# Patient Record
Sex: Female | Born: 1960
Health system: Southern US, Community
[De-identification: ages and names within clinical notes are randomized; demographics above are authoritative.]

## PROBLEM LIST (undated history)

## (undated) DIAGNOSIS — Z8739 Personal history of other diseases of the musculoskeletal system and connective tissue: Secondary | ICD-10-CM

## (undated) DIAGNOSIS — E559 Vitamin D deficiency, unspecified: Secondary | ICD-10-CM

## (undated) DIAGNOSIS — E785 Hyperlipidemia, unspecified: Secondary | ICD-10-CM

## (undated) DIAGNOSIS — M1990 Unspecified osteoarthritis, unspecified site: Secondary | ICD-10-CM

## (undated) DIAGNOSIS — C4490 Unspecified malignant neoplasm of skin, unspecified: Secondary | ICD-10-CM

## (undated) DIAGNOSIS — L309 Dermatitis, unspecified: Secondary | ICD-10-CM

## (undated) DIAGNOSIS — F419 Anxiety disorder, unspecified: Secondary | ICD-10-CM

## (undated) DIAGNOSIS — K829 Disease of gallbladder, unspecified: Secondary | ICD-10-CM

## (undated) DIAGNOSIS — I10 Essential (primary) hypertension: Secondary | ICD-10-CM

## (undated) DIAGNOSIS — M0520 Rheumatoid vasculitis with rheumatoid arthritis of unspecified site: Secondary | ICD-10-CM

## (undated) DIAGNOSIS — M7989 Other specified soft tissue disorders: Secondary | ICD-10-CM

## (undated) DIAGNOSIS — G2581 Restless legs syndrome: Secondary | ICD-10-CM

## (undated) DIAGNOSIS — K5900 Constipation, unspecified: Secondary | ICD-10-CM

## (undated) DIAGNOSIS — G4730 Sleep apnea, unspecified: Secondary | ICD-10-CM

## (undated) DIAGNOSIS — C801 Malignant (primary) neoplasm, unspecified: Secondary | ICD-10-CM

## (undated) HISTORY — DX: Hyperlipidemia, unspecified: E78.5

## (undated) HISTORY — DX: Unspecified malignant neoplasm of skin, unspecified: C44.90

## (undated) HISTORY — DX: Sleep apnea, unspecified: G47.30

## (undated) HISTORY — DX: Disease of gallbladder, unspecified: K82.9

## (undated) HISTORY — DX: Dermatitis, unspecified: L30.9

## (undated) HISTORY — DX: Vitamin D deficiency, unspecified: E55.9

## (undated) HISTORY — DX: Personal history of other diseases of the musculoskeletal system and connective tissue: Z87.39

## (undated) HISTORY — DX: Essential (primary) hypertension: I10

## (undated) HISTORY — DX: Restless legs syndrome: G25.81

## (undated) HISTORY — DX: Anxiety disorder, unspecified: F41.9

## (undated) HISTORY — PX: CARPECTOMY: SHX5004

## (undated) HISTORY — DX: Unspecified osteoarthritis, unspecified site: M19.90

## (undated) HISTORY — PX: REPLACEMENT TOTAL KNEE: SUR1224

## (undated) HISTORY — DX: Other specified soft tissue disorders: M79.89

## (undated) HISTORY — DX: Rheumatoid vasculitis with rheumatoid arthritis of unspecified site: M05.20

## (undated) HISTORY — PX: ABLATION: SHX5711

## (undated) HISTORY — PX: EYE SURGERY: SHX253

## (undated) HISTORY — PX: CHOLECYSTECTOMY: SHX55

## (undated) HISTORY — DX: Constipation, unspecified: K59.00

---

## 2005-09-21 ENCOUNTER — Ambulatory Visit: Payer: Self-pay | Admitting: Internal Medicine

## 2006-09-26 ENCOUNTER — Ambulatory Visit: Payer: Self-pay | Admitting: Internal Medicine

## 2007-10-03 ENCOUNTER — Ambulatory Visit: Payer: Self-pay | Admitting: Internal Medicine

## 2008-10-03 ENCOUNTER — Ambulatory Visit: Payer: Self-pay | Admitting: Internal Medicine

## 2009-10-08 ENCOUNTER — Ambulatory Visit: Payer: Self-pay | Admitting: Internal Medicine

## 2010-10-15 ENCOUNTER — Ambulatory Visit: Payer: Self-pay | Admitting: Obstetrics & Gynecology

## 2011-11-10 ENCOUNTER — Ambulatory Visit: Payer: Self-pay | Admitting: Obstetrics & Gynecology

## 2012-04-20 ENCOUNTER — Ambulatory Visit: Payer: Self-pay | Admitting: Internal Medicine

## 2012-04-28 ENCOUNTER — Ambulatory Visit: Payer: Self-pay | Admitting: Internal Medicine

## 2012-10-06 ENCOUNTER — Ambulatory Visit: Payer: Self-pay | Admitting: Unknown Physician Specialty

## 2012-11-14 ENCOUNTER — Ambulatory Visit: Payer: Self-pay | Admitting: Internal Medicine

## 2013-01-29 DIAGNOSIS — N3941 Urge incontinence: Secondary | ICD-10-CM | POA: Insufficient documentation

## 2013-01-29 DIAGNOSIS — R351 Nocturia: Secondary | ICD-10-CM | POA: Insufficient documentation

## 2013-01-29 DIAGNOSIS — N393 Stress incontinence (female) (male): Secondary | ICD-10-CM | POA: Insufficient documentation

## 2013-01-29 DIAGNOSIS — R350 Frequency of micturition: Secondary | ICD-10-CM | POA: Insufficient documentation

## 2013-08-23 ENCOUNTER — Ambulatory Visit: Payer: Self-pay | Admitting: Family Medicine

## 2013-08-28 ENCOUNTER — Ambulatory Visit: Payer: Self-pay | Admitting: Family Medicine

## 2013-09-17 DIAGNOSIS — R339 Retention of urine, unspecified: Secondary | ICD-10-CM | POA: Insufficient documentation

## 2013-11-23 ENCOUNTER — Ambulatory Visit: Payer: Self-pay | Admitting: Family Medicine

## 2014-03-14 ENCOUNTER — Ambulatory Visit: Payer: Self-pay | Admitting: Podiatrist

## 2014-10-07 DIAGNOSIS — R197 Diarrhea, unspecified: Secondary | ICD-10-CM | POA: Insufficient documentation

## 2014-10-07 DIAGNOSIS — R1011 Right upper quadrant pain: Secondary | ICD-10-CM | POA: Insufficient documentation

## 2014-10-08 ENCOUNTER — Ambulatory Visit: Payer: Self-pay | Admitting: Unknown Physician Specialty

## 2014-10-09 ENCOUNTER — Ambulatory Visit: Payer: Self-pay | Admitting: Unknown Physician Specialty

## 2014-10-09 LAB — CLOSTRIDIUM DIFFICILE(ARMC)

## 2014-10-11 LAB — WBCS, STOOL

## 2014-10-13 LAB — STOOL CULTURE

## 2014-11-25 ENCOUNTER — Ambulatory Visit: Payer: Self-pay | Admitting: Obstetrics & Gynecology

## 2015-12-24 ENCOUNTER — Other Ambulatory Visit: Payer: Self-pay | Admitting: Obstetrics & Gynecology

## 2015-12-24 DIAGNOSIS — Z1231 Encounter for screening mammogram for malignant neoplasm of breast: Secondary | ICD-10-CM

## 2015-12-25 ENCOUNTER — Ambulatory Visit
Admission: RE | Admit: 2015-12-25 | Discharge: 2015-12-25 | Disposition: A | Discharge status: Home / Self Care | Payer: BLUE CROSS/BLUE SHIELD | Source: Ambulatory Visit | Attending: Obstetrics & Gynecology | Admitting: Obstetrics & Gynecology

## 2015-12-25 DIAGNOSIS — Z1231 Encounter for screening mammogram for malignant neoplasm of breast: Secondary | ICD-10-CM | POA: Insufficient documentation

## 2015-12-25 LAB — HM MAMMOGRAPHY

## 2016-03-15 DIAGNOSIS — M25562 Pain in left knee: Secondary | ICD-10-CM | POA: Diagnosis not present

## 2016-03-17 DIAGNOSIS — M25562 Pain in left knee: Secondary | ICD-10-CM | POA: Diagnosis not present

## 2016-03-17 DIAGNOSIS — G4733 Obstructive sleep apnea (adult) (pediatric): Secondary | ICD-10-CM | POA: Diagnosis not present

## 2016-03-23 ENCOUNTER — Other Ambulatory Visit: Payer: Self-pay | Admitting: Physician Assistant

## 2016-03-23 DIAGNOSIS — R1084 Generalized abdominal pain: Secondary | ICD-10-CM | POA: Diagnosis not present

## 2016-03-23 DIAGNOSIS — R1011 Right upper quadrant pain: Secondary | ICD-10-CM

## 2016-03-23 DIAGNOSIS — R197 Diarrhea, unspecified: Secondary | ICD-10-CM | POA: Diagnosis not present

## 2016-03-29 ENCOUNTER — Ambulatory Visit
Admission: RE | Admit: 2016-03-29 | Discharge: 2016-03-29 | Disposition: A | Discharge status: Home / Self Care | Payer: BLUE CROSS/BLUE SHIELD | Source: Ambulatory Visit | Attending: Physician Assistant | Admitting: Physician Assistant

## 2016-03-29 DIAGNOSIS — R1011 Right upper quadrant pain: Secondary | ICD-10-CM

## 2016-03-29 DIAGNOSIS — K760 Fatty (change of) liver, not elsewhere classified: Secondary | ICD-10-CM | POA: Insufficient documentation

## 2016-03-29 DIAGNOSIS — R1084 Generalized abdominal pain: Secondary | ICD-10-CM | POA: Diagnosis not present

## 2016-03-29 LAB — POCT I-STAT CREATININE: CREATININE: 0.8 mg/dL (ref 0.44–1.00)

## 2016-03-29 MED ORDER — IOPAMIDOL (ISOVUE-300) INJECTION 61%
100.0000 mL | Freq: Once | INTRAVENOUS | Status: AC | PRN
  Administered 2016-03-29: 100 mL via INTRAVENOUS

## 2016-03-31 ENCOUNTER — Other Ambulatory Visit: Payer: Self-pay | Admitting: Physician Assistant

## 2016-03-31 DIAGNOSIS — K760 Fatty (change of) liver, not elsewhere classified: Secondary | ICD-10-CM

## 2016-04-05 DIAGNOSIS — M9903 Segmental and somatic dysfunction of lumbar region: Secondary | ICD-10-CM | POA: Diagnosis not present

## 2016-04-05 DIAGNOSIS — M5387 Other specified dorsopathies, lumbosacral region: Secondary | ICD-10-CM | POA: Diagnosis not present

## 2016-04-06 ENCOUNTER — Ambulatory Visit
Admission: RE | Admit: 2016-04-06 | Discharge: 2016-04-06 | Disposition: A | Discharge status: Home / Self Care | Payer: BLUE CROSS/BLUE SHIELD | Source: Ambulatory Visit | Attending: Physician Assistant | Admitting: Physician Assistant

## 2016-04-06 DIAGNOSIS — Z9049 Acquired absence of other specified parts of digestive tract: Secondary | ICD-10-CM | POA: Diagnosis not present

## 2016-04-06 DIAGNOSIS — K760 Fatty (change of) liver, not elsewhere classified: Secondary | ICD-10-CM | POA: Insufficient documentation

## 2016-04-16 DIAGNOSIS — F419 Anxiety disorder, unspecified: Secondary | ICD-10-CM | POA: Diagnosis not present

## 2016-04-16 DIAGNOSIS — G2581 Restless legs syndrome: Secondary | ICD-10-CM | POA: Diagnosis not present

## 2016-04-27 DIAGNOSIS — R1084 Generalized abdominal pain: Secondary | ICD-10-CM | POA: Diagnosis not present

## 2016-04-27 DIAGNOSIS — K635 Polyp of colon: Secondary | ICD-10-CM | POA: Diagnosis not present

## 2016-04-27 DIAGNOSIS — R197 Diarrhea, unspecified: Secondary | ICD-10-CM | POA: Diagnosis not present

## 2016-04-27 DIAGNOSIS — D126 Benign neoplasm of colon, unspecified: Secondary | ICD-10-CM | POA: Diagnosis not present

## 2016-04-27 DIAGNOSIS — K621 Rectal polyp: Secondary | ICD-10-CM | POA: Diagnosis not present

## 2016-04-27 DIAGNOSIS — K640 First degree hemorrhoids: Secondary | ICD-10-CM | POA: Diagnosis not present

## 2016-04-27 DIAGNOSIS — K5730 Diverticulosis of large intestine without perforation or abscess without bleeding: Secondary | ICD-10-CM | POA: Diagnosis not present

## 2016-04-27 DIAGNOSIS — K648 Other hemorrhoids: Secondary | ICD-10-CM | POA: Diagnosis not present

## 2016-05-07 DIAGNOSIS — M9901 Segmental and somatic dysfunction of cervical region: Secondary | ICD-10-CM | POA: Diagnosis not present

## 2016-05-07 DIAGNOSIS — M5387 Other specified dorsopathies, lumbosacral region: Secondary | ICD-10-CM | POA: Diagnosis not present

## 2016-05-07 DIAGNOSIS — M9903 Segmental and somatic dysfunction of lumbar region: Secondary | ICD-10-CM | POA: Diagnosis not present

## 2016-05-07 DIAGNOSIS — M9902 Segmental and somatic dysfunction of thoracic region: Secondary | ICD-10-CM | POA: Diagnosis not present

## 2016-05-10 DIAGNOSIS — K5900 Constipation, unspecified: Secondary | ICD-10-CM | POA: Diagnosis not present

## 2016-05-10 DIAGNOSIS — K760 Fatty (change of) liver, not elsewhere classified: Secondary | ICD-10-CM | POA: Diagnosis not present

## 2016-05-12 DIAGNOSIS — M9901 Segmental and somatic dysfunction of cervical region: Secondary | ICD-10-CM | POA: Diagnosis not present

## 2016-05-12 DIAGNOSIS — M9902 Segmental and somatic dysfunction of thoracic region: Secondary | ICD-10-CM | POA: Diagnosis not present

## 2016-05-12 DIAGNOSIS — M5387 Other specified dorsopathies, lumbosacral region: Secondary | ICD-10-CM | POA: Diagnosis not present

## 2016-05-12 DIAGNOSIS — M9903 Segmental and somatic dysfunction of lumbar region: Secondary | ICD-10-CM | POA: Diagnosis not present

## 2016-05-19 DIAGNOSIS — R87610 Atypical squamous cells of undetermined significance on cytologic smear of cervix (ASC-US): Secondary | ICD-10-CM | POA: Diagnosis not present

## 2016-05-20 DIAGNOSIS — G2581 Restless legs syndrome: Secondary | ICD-10-CM | POA: Diagnosis not present

## 2016-05-20 DIAGNOSIS — F419 Anxiety disorder, unspecified: Secondary | ICD-10-CM | POA: Diagnosis not present

## 2016-05-26 DIAGNOSIS — M5386 Other specified dorsopathies, lumbar region: Secondary | ICD-10-CM | POA: Diagnosis not present

## 2016-05-26 DIAGNOSIS — G5721 Lesion of femoral nerve, right lower limb: Secondary | ICD-10-CM | POA: Diagnosis not present

## 2016-06-29 DIAGNOSIS — F419 Anxiety disorder, unspecified: Secondary | ICD-10-CM | POA: Diagnosis not present

## 2016-07-28 DIAGNOSIS — M530 Cervicocranial syndrome: Secondary | ICD-10-CM | POA: Diagnosis not present

## 2016-07-28 DIAGNOSIS — G44219 Episodic tension-type headache, not intractable: Secondary | ICD-10-CM | POA: Diagnosis not present

## 2016-07-29 DIAGNOSIS — L710 Perioral dermatitis: Secondary | ICD-10-CM | POA: Diagnosis not present

## 2016-09-08 DIAGNOSIS — M531 Cervicobrachial syndrome: Secondary | ICD-10-CM | POA: Diagnosis not present

## 2016-09-08 DIAGNOSIS — M9901 Segmental and somatic dysfunction of cervical region: Secondary | ICD-10-CM | POA: Diagnosis not present

## 2016-09-08 DIAGNOSIS — M436 Torticollis: Secondary | ICD-10-CM | POA: Diagnosis not present

## 2016-09-28 DIAGNOSIS — M9902 Segmental and somatic dysfunction of thoracic region: Secondary | ICD-10-CM | POA: Diagnosis not present

## 2016-09-28 DIAGNOSIS — M5387 Other specified dorsopathies, lumbosacral region: Secondary | ICD-10-CM | POA: Diagnosis not present

## 2016-09-28 DIAGNOSIS — M531 Cervicobrachial syndrome: Secondary | ICD-10-CM | POA: Diagnosis not present

## 2016-10-06 DIAGNOSIS — M25562 Pain in left knee: Secondary | ICD-10-CM | POA: Diagnosis not present

## 2016-11-15 ENCOUNTER — Other Ambulatory Visit: Payer: Self-pay | Admitting: Obstetrics & Gynecology

## 2016-11-15 DIAGNOSIS — Z1231 Encounter for screening mammogram for malignant neoplasm of breast: Secondary | ICD-10-CM

## 2016-12-27 ENCOUNTER — Ambulatory Visit
Admission: RE | Admit: 2016-12-27 | Discharge: 2016-12-27 | Disposition: A | Discharge status: Home / Self Care | Payer: BLUE CROSS/BLUE SHIELD | Source: Ambulatory Visit | Attending: Obstetrics & Gynecology | Admitting: Obstetrics & Gynecology

## 2016-12-27 DIAGNOSIS — Z1231 Encounter for screening mammogram for malignant neoplasm of breast: Secondary | ICD-10-CM

## 2016-12-27 HISTORY — DX: Malignant (primary) neoplasm, unspecified: C80.1

## 2016-12-29 DIAGNOSIS — Z01419 Encounter for gynecological examination (general) (routine) without abnormal findings: Secondary | ICD-10-CM | POA: Diagnosis not present

## 2016-12-29 DIAGNOSIS — R232 Flushing: Secondary | ICD-10-CM | POA: Diagnosis not present

## 2017-02-02 DIAGNOSIS — M5387 Other specified dorsopathies, lumbosacral region: Secondary | ICD-10-CM | POA: Diagnosis not present

## 2017-02-02 DIAGNOSIS — M9901 Segmental and somatic dysfunction of cervical region: Secondary | ICD-10-CM | POA: Diagnosis not present

## 2017-02-02 DIAGNOSIS — M9902 Segmental and somatic dysfunction of thoracic region: Secondary | ICD-10-CM | POA: Diagnosis not present

## 2017-02-02 DIAGNOSIS — G44219 Episodic tension-type headache, not intractable: Secondary | ICD-10-CM | POA: Diagnosis not present

## 2017-02-03 DIAGNOSIS — G4733 Obstructive sleep apnea (adult) (pediatric): Secondary | ICD-10-CM | POA: Diagnosis not present

## 2017-02-11 DIAGNOSIS — B351 Tinea unguium: Secondary | ICD-10-CM | POA: Diagnosis not present

## 2017-02-22 ENCOUNTER — Ambulatory Visit (INDEPENDENT_AMBULATORY_CARE_PROVIDER_SITE_OTHER): Payer: BLUE CROSS/BLUE SHIELD | Admitting: Podiatry

## 2017-02-22 ENCOUNTER — Ambulatory Visit (INDEPENDENT_AMBULATORY_CARE_PROVIDER_SITE_OTHER): Payer: BLUE CROSS/BLUE SHIELD

## 2017-02-22 ENCOUNTER — Encounter: Payer: Self-pay | Admitting: Podiatry

## 2017-02-22 ENCOUNTER — Ambulatory Visit: Payer: BLUE CROSS/BLUE SHIELD

## 2017-02-22 DIAGNOSIS — M21612 Bunion of left foot: Secondary | ICD-10-CM | POA: Diagnosis not present

## 2017-02-22 DIAGNOSIS — M21619 Bunion of unspecified foot: Secondary | ICD-10-CM | POA: Diagnosis not present

## 2017-02-22 DIAGNOSIS — M722 Plantar fascial fibromatosis: Secondary | ICD-10-CM

## 2017-02-22 DIAGNOSIS — E119 Type 2 diabetes mellitus without complications: Secondary | ICD-10-CM | POA: Insufficient documentation

## 2017-02-22 DIAGNOSIS — M2012 Hallux valgus (acquired), left foot: Secondary | ICD-10-CM

## 2017-02-22 DIAGNOSIS — I10 Essential (primary) hypertension: Secondary | ICD-10-CM | POA: Insufficient documentation

## 2017-02-22 DIAGNOSIS — M79671 Pain in right foot: Secondary | ICD-10-CM

## 2017-02-22 DIAGNOSIS — G4730 Sleep apnea, unspecified: Secondary | ICD-10-CM | POA: Insufficient documentation

## 2017-03-01 MED ORDER — BETAMETHASONE SOD PHOS & ACET 6 (3-3) MG/ML IJ SUSP: 3.0000 mg | Freq: Once | INTRAMUSCULAR | Status: DC

## 2017-03-01 NOTE — Progress Notes (Signed)
   Subjective: Patient presents today for pain and tenderness in the right foot. Patient states the foot pain has been hurting for several weeks now. Patient states that it hurts in the mornings with the first steps out of bed. Patient presents today for further treatment and evaluation Patient has a history of left knee arthritis and is taking Duexis.  Objective: Physical Exam General: The patient is alert and oriented x3 in no acute distress.  Dermatology: Skin is warm, dry and supple bilateral lower extremities. Negative for open lesions or macerations bilateral.   Vascular: Dorsalis Pedis and Posterior Tibial pulses palpable bilateral.  Capillary fill time is immediate to all digits.  Neurological: Epicritic and protective threshold intact bilateral.   Musculoskeletal: Tenderness to palpation at the medial calcaneal tubercale and through the insertion of the plantar fascia of the right foot. All other joints range of motion within normal limits bilateral. Strength 5/5 in all groups bilateral.  Hallux abductovalgus with bunion deformity also noted to the left foot. Minimal pain noted on range of motion.  Radiographic exam: Normal osseous mineralization. Joint spaces preserved. No fracture/dislocation/boney destruction. Calcaneal spur present with mild thickening of plantar fascia right. No other soft tissue abnormalities or radiopaque foreign bodies.  Increased intermetatarsal angle noted to the first interspace of the left foot greater than 15. Hallux abductus angle greater than 25.  Assessment: 1. Plantar fasciitis right 2. Pain in right foot 3. Hallux abductovalgus with bunion deformity left foot  Plan of Care:  1. Patient evaluated. Xrays reviewed.   2. Injection of 0.5cc Celestone soluspan injected into the right heel at the insertion of the plantar fascia.  3. Instructed patient regarding therapies and modalities at home to alleviate symptoms.  4. Continue Duexis 5. Today  the patient was scanned for custom molded orthotics  6. Plantar fascial band(s) dispensed. 7. Return to clinic in 4 weeks to pick up orthotics and be reevaluated     Edrick Kins, DPM [facility]  Dr. Edrick Kins, [address]                                        [place], [place] [address]                Office [phone]  Fax [phone]

## 2017-03-22 ENCOUNTER — Ambulatory Visit (INDEPENDENT_AMBULATORY_CARE_PROVIDER_SITE_OTHER): Payer: BLUE CROSS/BLUE SHIELD | Admitting: Podiatry

## 2017-03-22 DIAGNOSIS — M722 Plantar fascial fibromatosis: Secondary | ICD-10-CM | POA: Diagnosis not present

## 2017-03-22 NOTE — Patient Instructions (Signed)

## 2017-03-23 NOTE — Progress Notes (Signed)
   Subjective: Patient presents today for continued pain and tenderness in the right foot. Patient states his pain is the same as foot is no better. He reports the injection and Duexis are not helping. Patient has a history of left knee arthritis and is taking Duexis.  Objective: Physical Exam General: The patient is alert and oriented x3 in no acute distress.  Dermatology: Skin is warm, dry and supple bilateral lower extremities. Negative for open lesions or macerations bilateral.   Vascular: Dorsalis Pedis and Posterior Tibial pulses palpable bilateral.  Capillary fill time is immediate to all digits.  Neurological: Epicritic and protective threshold intact bilateral.   Musculoskeletal: Tenderness to palpation at the medial calcaneal tubercale and through the insertion of the plantar fascia of the right foot. All other joints range of motion within normal limits bilateral. Strength 5/5 in all groups bilateral.  Hallux abductovalgus with bunion deformity also noted to the left foot. Minimal pain noted on range of motion.   Assessment: 1. Plantar fasciitis right 2. Pain in right foot 3. Hallux abductovalgus with bunion deformity left foot  Plan of Care:  1. Patient evaluated.  2. Orthotics to prevent with break-in instructions provided 3. Discussed EPAT and physical therapy 4. Patient will call if she wants to start 5. Return to clinic in 4 weeks  Edrick Kins, DPM [facility]  Dr. Edrick Kins, [address]                                        [place], [place] [address]                Office [phone]  Fax [phone]

## 2017-04-06 DIAGNOSIS — M722 Plantar fascial fibromatosis: Secondary | ICD-10-CM | POA: Diagnosis not present

## 2017-04-06 DIAGNOSIS — M5387 Other specified dorsopathies, lumbosacral region: Secondary | ICD-10-CM | POA: Diagnosis not present

## 2017-04-11 DIAGNOSIS — M722 Plantar fascial fibromatosis: Secondary | ICD-10-CM | POA: Diagnosis not present

## 2017-04-19 ENCOUNTER — Ambulatory Visit: Payer: BLUE CROSS/BLUE SHIELD | Admitting: Podiatry

## 2017-04-19 DIAGNOSIS — M9901 Segmental and somatic dysfunction of cervical region: Secondary | ICD-10-CM | POA: Diagnosis not present

## 2017-04-19 DIAGNOSIS — M722 Plantar fascial fibromatosis: Secondary | ICD-10-CM | POA: Diagnosis not present

## 2017-05-18 DIAGNOSIS — M25571 Pain in right ankle and joints of right foot: Secondary | ICD-10-CM | POA: Diagnosis not present

## 2017-05-18 DIAGNOSIS — M25562 Pain in left knee: Secondary | ICD-10-CM | POA: Diagnosis not present

## 2017-05-23 DIAGNOSIS — M722 Plantar fascial fibromatosis: Secondary | ICD-10-CM | POA: Diagnosis not present

## 2017-05-23 DIAGNOSIS — M79671 Pain in right foot: Secondary | ICD-10-CM | POA: Diagnosis not present

## 2017-05-26 DIAGNOSIS — M79671 Pain in right foot: Secondary | ICD-10-CM | POA: Diagnosis not present

## 2017-05-26 DIAGNOSIS — M722 Plantar fascial fibromatosis: Secondary | ICD-10-CM | POA: Diagnosis not present

## 2017-05-30 DIAGNOSIS — M79671 Pain in right foot: Secondary | ICD-10-CM | POA: Diagnosis not present

## 2017-05-30 DIAGNOSIS — M722 Plantar fascial fibromatosis: Secondary | ICD-10-CM | POA: Diagnosis not present

## 2017-06-01 DIAGNOSIS — M722 Plantar fascial fibromatosis: Secondary | ICD-10-CM | POA: Diagnosis not present

## 2017-06-01 DIAGNOSIS — M79671 Pain in right foot: Secondary | ICD-10-CM | POA: Diagnosis not present

## 2017-06-07 DIAGNOSIS — M79671 Pain in right foot: Secondary | ICD-10-CM | POA: Diagnosis not present

## 2017-06-07 DIAGNOSIS — M722 Plantar fascial fibromatosis: Secondary | ICD-10-CM | POA: Diagnosis not present

## 2017-06-09 DIAGNOSIS — M79671 Pain in right foot: Secondary | ICD-10-CM | POA: Diagnosis not present

## 2017-06-09 DIAGNOSIS — M722 Plantar fascial fibromatosis: Secondary | ICD-10-CM | POA: Diagnosis not present

## 2017-06-15 DIAGNOSIS — M9901 Segmental and somatic dysfunction of cervical region: Secondary | ICD-10-CM | POA: Diagnosis not present

## 2017-06-15 DIAGNOSIS — M531 Cervicobrachial syndrome: Secondary | ICD-10-CM | POA: Diagnosis not present

## 2017-06-15 DIAGNOSIS — M9902 Segmental and somatic dysfunction of thoracic region: Secondary | ICD-10-CM | POA: Diagnosis not present

## 2017-06-15 DIAGNOSIS — M4005 Postural kyphosis, thoracolumbar region: Secondary | ICD-10-CM | POA: Diagnosis not present

## 2017-06-27 DIAGNOSIS — M7989 Other specified soft tissue disorders: Secondary | ICD-10-CM | POA: Diagnosis not present

## 2017-06-27 DIAGNOSIS — M150 Primary generalized (osteo)arthritis: Secondary | ICD-10-CM | POA: Diagnosis not present

## 2017-07-18 DIAGNOSIS — M9901 Segmental and somatic dysfunction of cervical region: Secondary | ICD-10-CM | POA: Diagnosis not present

## 2017-07-18 DIAGNOSIS — M9902 Segmental and somatic dysfunction of thoracic region: Secondary | ICD-10-CM | POA: Diagnosis not present

## 2017-07-18 DIAGNOSIS — G44219 Episodic tension-type headache, not intractable: Secondary | ICD-10-CM | POA: Diagnosis not present

## 2017-07-18 DIAGNOSIS — M530 Cervicocranial syndrome: Secondary | ICD-10-CM | POA: Diagnosis not present

## 2017-08-01 DIAGNOSIS — M7989 Other specified soft tissue disorders: Secondary | ICD-10-CM | POA: Diagnosis not present

## 2017-08-01 DIAGNOSIS — M1990 Unspecified osteoarthritis, unspecified site: Secondary | ICD-10-CM | POA: Diagnosis not present

## 2017-08-01 DIAGNOSIS — M150 Primary generalized (osteo)arthritis: Secondary | ICD-10-CM | POA: Diagnosis not present

## 2017-08-19 DIAGNOSIS — D2261 Melanocytic nevi of right upper limb, including shoulder: Secondary | ICD-10-CM | POA: Diagnosis not present

## 2017-08-26 DIAGNOSIS — M9902 Segmental and somatic dysfunction of thoracic region: Secondary | ICD-10-CM | POA: Diagnosis not present

## 2017-08-26 DIAGNOSIS — M5386 Other specified dorsopathies, lumbar region: Secondary | ICD-10-CM | POA: Diagnosis not present

## 2017-08-26 DIAGNOSIS — M9903 Segmental and somatic dysfunction of lumbar region: Secondary | ICD-10-CM | POA: Diagnosis not present

## 2017-08-26 DIAGNOSIS — M531 Cervicobrachial syndrome: Secondary | ICD-10-CM | POA: Diagnosis not present

## 2017-08-30 DIAGNOSIS — M436 Torticollis: Secondary | ICD-10-CM | POA: Diagnosis not present

## 2017-08-30 DIAGNOSIS — M531 Cervicobrachial syndrome: Secondary | ICD-10-CM | POA: Diagnosis not present

## 2017-08-30 DIAGNOSIS — G44219 Episodic tension-type headache, not intractable: Secondary | ICD-10-CM | POA: Diagnosis not present

## 2017-08-30 DIAGNOSIS — M9901 Segmental and somatic dysfunction of cervical region: Secondary | ICD-10-CM | POA: Diagnosis not present

## 2017-10-18 DIAGNOSIS — G4733 Obstructive sleep apnea (adult) (pediatric): Secondary | ICD-10-CM | POA: Diagnosis not present

## 2017-11-23 DIAGNOSIS — M7611 Psoas tendinitis, right hip: Secondary | ICD-10-CM | POA: Diagnosis not present

## 2017-11-23 DIAGNOSIS — M5386 Other specified dorsopathies, lumbar region: Secondary | ICD-10-CM | POA: Diagnosis not present

## 2017-11-23 DIAGNOSIS — M9903 Segmental and somatic dysfunction of lumbar region: Secondary | ICD-10-CM | POA: Diagnosis not present

## 2017-12-07 ENCOUNTER — Other Ambulatory Visit: Payer: Self-pay | Admitting: Obstetrics & Gynecology

## 2017-12-07 DIAGNOSIS — Z1231 Encounter for screening mammogram for malignant neoplasm of breast: Secondary | ICD-10-CM

## 2017-12-23 DIAGNOSIS — M1990 Unspecified osteoarthritis, unspecified site: Secondary | ICD-10-CM | POA: Diagnosis not present

## 2017-12-23 DIAGNOSIS — M150 Primary generalized (osteo)arthritis: Secondary | ICD-10-CM | POA: Diagnosis not present

## 2017-12-23 DIAGNOSIS — M0609 Rheumatoid arthritis without rheumatoid factor, multiple sites: Secondary | ICD-10-CM | POA: Diagnosis not present

## 2017-12-29 ENCOUNTER — Ambulatory Visit
Admission: RE | Admit: 2017-12-29 | Discharge: 2017-12-29 | Disposition: A | Discharge status: Home / Self Care | Payer: BLUE CROSS/BLUE SHIELD | Source: Ambulatory Visit | Attending: Obstetrics & Gynecology | Admitting: Obstetrics & Gynecology

## 2017-12-29 DIAGNOSIS — Z1231 Encounter for screening mammogram for malignant neoplasm of breast: Secondary | ICD-10-CM | POA: Insufficient documentation

## 2017-12-30 ENCOUNTER — Ambulatory Visit (INDEPENDENT_AMBULATORY_CARE_PROVIDER_SITE_OTHER): Payer: BLUE CROSS/BLUE SHIELD | Admitting: Obstetrics & Gynecology

## 2017-12-30 ENCOUNTER — Encounter: Payer: Self-pay | Admitting: Obstetrics & Gynecology

## 2017-12-30 VITALS — BP 130/70 | HR 80 | Ht 63.0 in | Wt 197.0 lb

## 2017-12-30 DIAGNOSIS — Z1211 Encounter for screening for malignant neoplasm of colon: Secondary | ICD-10-CM | POA: Diagnosis not present

## 2017-12-30 DIAGNOSIS — Z0000 Encounter for general adult medical examination without abnormal findings: Secondary | ICD-10-CM | POA: Diagnosis not present

## 2017-12-30 DIAGNOSIS — Z124 Encounter for screening for malignant neoplasm of cervix: Secondary | ICD-10-CM

## 2017-12-30 LAB — HM PAP SMEAR

## 2017-12-30 NOTE — Progress Notes (Signed)
HPI:      Cheryl Shepherd is a 57 y.o. [id number] who LMP was in the past, she presents today for her annual examination.  The patient has no complaints today. The patient is sexually active. Herlast pap: approximate date 2018 and was normal and last mammogram: approximate date 12/2017 and was normal.  The patient does perform self breast exams.  There is notable family history of breast or ovarian cancer in her family. The patient is not taking hormone replacement therapy. Patient denies post-menopausal vaginal bleeding.   The patient has regular exercise: yes. The patient denies current symptoms of depression.    GYN Hx: Last Colonoscopy:2 years ago. Normal.  Last DEXA: never ago.    PMHx: Past Medical History:  Diagnosis Date  . Cancer ([place])    skin  . Rheumatoid arteritis    History reviewed. No pertinent surgical history. Family History  Problem Relation Age of Onset  . Breast cancer Mother 50   Social History   Tobacco Use  . Smoking status: Current Every Day Smoker  . Smokeless tobacco: Never Used  Substance Use Topics  . Alcohol use: No    Frequency: Never  . Drug use: No    Current Outpatient Medications:  .  albuterol (PROVENTIL HFA;VENTOLIN HFA) 108 (90 Base) MCG/ACT inhaler, , Disp: , Rfl:  .  azithromycin (ZITHROMAX Z-PAK) 250 MG tablet, , Disp: , Rfl:  .  buPROPion (WELLBUTRIN XL) 150 MG 24 hr tablet, Take by mouth., Disp: , Rfl:  .  DUEXIS 800-26.6 MG TABS, Take 1 tablet by mouth 3 (three) times daily as needed., Disp: , Rfl: 2 .  gabapentin (NEURONTIN) 100 MG capsule, , Disp: , Rfl:  .  hydrochlorothiazide (HYDRODIURIL) 25 MG tablet, Take by mouth., Disp: , Rfl:  .  imipramine (TOFRANIL) 25 MG tablet, Take 25 mg by mouth., Disp: , Rfl:  .  metFORMIN (GLUCOPHAGE-XR) 500 MG 24 hr tablet, Take by mouth., Disp: , Rfl:  .  oseltamivir (TAMIFLU) 75 MG capsule, TAKE ONE CAPSULE BY MOUTH ONCE DAILY FOR 10 DAYS, Disp: , Rfl: 0 .  PARoxetine (PAXIL) 10 MG tablet, Take  by mouth., Disp: , Rfl:  .  pramipexole (MIRAPEX) 0.5 MG tablet, Take by mouth., Disp: , Rfl:  .  pramipexole (MIRAPEX) 1 MG tablet, Take 1 mg by mouth., Disp: , Rfl:  .  predniSONE (DELTASONE) 20 MG tablet, TAKE 2 TABLETS BY MOUTH DAILY FOR 3 DAYS, Disp: , Rfl: 0  Current Facility-Administered Medications:  .  betamethasone acetate-betamethasone sodium phosphate (CELESTONE) injection 3 mg, 3 mg, Intramuscular, Once, Evans, Brent M, DPM Allergies: Cortisone; Penicillin g; Sulfa antibiotics; Sulfacetamide; and Penicillins  Review of Systems  Constitutional: Negative for chills, fever and malaise/fatigue.  HENT: Negative for congestion, sinus pain and sore throat.   Eyes: Negative for blurred vision and pain.  Respiratory: Negative for cough and wheezing.   Cardiovascular: Negative for chest pain and leg swelling.  Gastrointestinal: Negative for abdominal pain, constipation, diarrhea, heartburn, nausea and vomiting.  Genitourinary: Negative for dysuria, frequency, hematuria and urgency.  Musculoskeletal: Negative for back pain, joint pain, myalgias and neck pain.  Skin: Negative for itching and rash.  Neurological: Negative for dizziness, tremors and weakness.  Endo/Heme/Allergies: Does not bruise/bleed easily.  Psychiatric/Behavioral: Negative for depression. The patient is not nervous/anxious and does not have insomnia.     Objective: BP 130/70   Pulse 80   Ht 5\' 3"  (1.6 m)   Wt 197 lb (89.4 kg)  BMI 34.90 kg/m   Filed Weights   12/30/17 0805  Weight: 197 lb (89.4 kg)   Body mass index is 34.9 kg/m. Physical Exam  Constitutional: She is oriented to person, place, and time. She appears well-developed and well-nourished. No distress.  Genitourinary: Rectum normal, vagina normal and uterus normal. Pelvic exam was performed with patient supine. There is no rash or lesion on the right labia. There is no rash or lesion on the left labia. Vagina exhibits no lesion. No bleeding in the  vagina. Right adnexum does not display mass and does not display tenderness. Left adnexum does not display mass and does not display tenderness. Cervix does not exhibit motion tenderness, lesion, friability or polyp.   Uterus is mobile and midaxial. Uterus is not enlarged or exhibiting a mass.  HENT:  Head: Normocephalic and atraumatic. Head is without laceration.  Right Ear: Hearing normal.  Left Ear: Hearing normal.  Nose: No epistaxis.  No foreign bodies.  Mouth/Throat: Uvula is midline, oropharynx is clear and moist and mucous membranes are normal.  Eyes: Pupils are equal, round, and reactive to light.  Neck: Normal range of motion. Neck supple. No thyromegaly present.  Cardiovascular: Normal rate and regular rhythm. Exam reveals no gallop and no friction rub.  No murmur heard. Pulmonary/Chest: Effort normal and breath sounds normal. No respiratory distress. She has no wheezes. Right breast exhibits no mass, no skin change and no tenderness. Left breast exhibits no mass, no skin change and no tenderness.  Abdominal: Soft. Bowel sounds are normal. She exhibits no distension. There is no tenderness. There is no rebound.  Musculoskeletal: Normal range of motion.  Neurological: She is alert and oriented to person, place, and time. No cranial nerve deficit.  Skin: Skin is warm and dry.  Psychiatric: She has a normal mood and affect. Judgment normal.  Vitals reviewed.   Assessment: Annual Exam 1. Annual physical exam   2. Screening for cervical cancer   3. Screen for colon cancer     Plan:            1.  Cervical Screening-  Pap smear done today  2. Breast screening- Exam annually and mammogram scheduled  3. Colonoscopy every 10 years, Hemoccult testing after age 8  4. Labs managed by PCP  5. Counseling for hormonal therapy: none, no change in therapy today     F/U  Return in about 1 year (around 12/30/2018) for Annual.  Barnett Applebaum, MD, [facility], [facility] 12/30/2017  8:32 AM

## 2017-12-30 NOTE — Patient Instructions (Signed)
PAP every year Mammogram every year    Call [phone] to schedule at [facility] Colonoscopy every 5 or 10 years (done 2017) Labs yearly (with PCP)

## 2018-01-06 LAB — IGP, APTIMA HPV
HPV APTIMA: NEGATIVE
PAP SMEAR COMMENT: 0

## 2018-01-24 DIAGNOSIS — M9903 Segmental and somatic dysfunction of lumbar region: Secondary | ICD-10-CM | POA: Diagnosis not present

## 2018-01-24 DIAGNOSIS — M5386 Other specified dorsopathies, lumbar region: Secondary | ICD-10-CM | POA: Diagnosis not present

## 2018-01-24 DIAGNOSIS — M530 Cervicocranial syndrome: Secondary | ICD-10-CM | POA: Diagnosis not present

## 2018-01-26 DIAGNOSIS — G4733 Obstructive sleep apnea (adult) (pediatric): Secondary | ICD-10-CM | POA: Diagnosis not present

## 2018-03-17 DIAGNOSIS — M150 Primary generalized (osteo)arthritis: Secondary | ICD-10-CM | POA: Diagnosis not present

## 2018-03-17 DIAGNOSIS — R5382 Chronic fatigue, unspecified: Secondary | ICD-10-CM | POA: Diagnosis not present

## 2018-03-17 DIAGNOSIS — M1990 Unspecified osteoarthritis, unspecified site: Secondary | ICD-10-CM | POA: Diagnosis not present

## 2018-03-17 DIAGNOSIS — M0609 Rheumatoid arthritis without rheumatoid factor, multiple sites: Secondary | ICD-10-CM | POA: Diagnosis not present

## 2018-03-23 DIAGNOSIS — M150 Primary generalized (osteo)arthritis: Secondary | ICD-10-CM | POA: Diagnosis not present

## 2018-03-23 DIAGNOSIS — M25562 Pain in left knee: Secondary | ICD-10-CM | POA: Diagnosis not present

## 2018-03-23 DIAGNOSIS — M1990 Unspecified osteoarthritis, unspecified site: Secondary | ICD-10-CM | POA: Diagnosis not present

## 2018-03-23 DIAGNOSIS — M0609 Rheumatoid arthritis without rheumatoid factor, multiple sites: Secondary | ICD-10-CM | POA: Diagnosis not present

## 2018-05-02 DIAGNOSIS — G4733 Obstructive sleep apnea (adult) (pediatric): Secondary | ICD-10-CM | POA: Diagnosis not present

## 2018-05-03 DIAGNOSIS — M531 Cervicobrachial syndrome: Secondary | ICD-10-CM | POA: Diagnosis not present

## 2018-05-03 DIAGNOSIS — M9902 Segmental and somatic dysfunction of thoracic region: Secondary | ICD-10-CM | POA: Diagnosis not present

## 2018-06-16 DIAGNOSIS — M0609 Rheumatoid arthritis without rheumatoid factor, multiple sites: Secondary | ICD-10-CM | POA: Diagnosis not present

## 2018-06-16 DIAGNOSIS — M25562 Pain in left knee: Secondary | ICD-10-CM | POA: Diagnosis not present

## 2018-06-16 DIAGNOSIS — M150 Primary generalized (osteo)arthritis: Secondary | ICD-10-CM | POA: Diagnosis not present

## 2018-06-16 DIAGNOSIS — M1990 Unspecified osteoarthritis, unspecified site: Secondary | ICD-10-CM | POA: Diagnosis not present

## 2018-06-21 DIAGNOSIS — M531 Cervicobrachial syndrome: Secondary | ICD-10-CM | POA: Diagnosis not present

## 2018-06-21 DIAGNOSIS — M5386 Other specified dorsopathies, lumbar region: Secondary | ICD-10-CM | POA: Diagnosis not present

## 2018-06-21 DIAGNOSIS — M436 Torticollis: Secondary | ICD-10-CM | POA: Diagnosis not present

## 2018-07-18 DIAGNOSIS — M9901 Segmental and somatic dysfunction of cervical region: Secondary | ICD-10-CM | POA: Diagnosis not present

## 2018-07-18 DIAGNOSIS — M436 Torticollis: Secondary | ICD-10-CM | POA: Diagnosis not present

## 2018-07-18 DIAGNOSIS — M531 Cervicobrachial syndrome: Secondary | ICD-10-CM | POA: Diagnosis not present

## 2018-08-18 DIAGNOSIS — G4733 Obstructive sleep apnea (adult) (pediatric): Secondary | ICD-10-CM | POA: Diagnosis not present

## 2018-09-06 DIAGNOSIS — M5387 Other specified dorsopathies, lumbosacral region: Secondary | ICD-10-CM | POA: Diagnosis not present

## 2018-09-06 DIAGNOSIS — M531 Cervicobrachial syndrome: Secondary | ICD-10-CM | POA: Diagnosis not present

## 2018-09-06 DIAGNOSIS — M9902 Segmental and somatic dysfunction of thoracic region: Secondary | ICD-10-CM | POA: Diagnosis not present

## 2018-09-22 DIAGNOSIS — M150 Primary generalized (osteo)arthritis: Secondary | ICD-10-CM | POA: Diagnosis not present

## 2018-09-22 DIAGNOSIS — R5382 Chronic fatigue, unspecified: Secondary | ICD-10-CM | POA: Diagnosis not present

## 2018-09-22 DIAGNOSIS — Z79899 Other long term (current) drug therapy: Secondary | ICD-10-CM | POA: Diagnosis not present

## 2018-09-22 DIAGNOSIS — M0609 Rheumatoid arthritis without rheumatoid factor, multiple sites: Secondary | ICD-10-CM | POA: Diagnosis not present

## 2018-10-25 DIAGNOSIS — D225 Melanocytic nevi of trunk: Secondary | ICD-10-CM | POA: Diagnosis not present

## 2018-10-25 DIAGNOSIS — D2261 Melanocytic nevi of right upper limb, including shoulder: Secondary | ICD-10-CM | POA: Diagnosis not present

## 2018-10-25 DIAGNOSIS — L718 Other rosacea: Secondary | ICD-10-CM | POA: Diagnosis not present

## 2018-10-25 DIAGNOSIS — D2262 Melanocytic nevi of left upper limb, including shoulder: Secondary | ICD-10-CM | POA: Diagnosis not present

## 2018-10-31 DIAGNOSIS — M5386 Other specified dorsopathies, lumbar region: Secondary | ICD-10-CM | POA: Diagnosis not present

## 2018-10-31 DIAGNOSIS — M9903 Segmental and somatic dysfunction of lumbar region: Secondary | ICD-10-CM | POA: Diagnosis not present

## 2018-10-31 DIAGNOSIS — M9902 Segmental and somatic dysfunction of thoracic region: Secondary | ICD-10-CM | POA: Diagnosis not present

## 2018-10-31 DIAGNOSIS — M530 Cervicocranial syndrome: Secondary | ICD-10-CM | POA: Diagnosis not present

## 2018-12-04 ENCOUNTER — Other Ambulatory Visit: Payer: Self-pay | Admitting: Obstetrics & Gynecology

## 2018-12-04 DIAGNOSIS — Z1231 Encounter for screening mammogram for malignant neoplasm of breast: Secondary | ICD-10-CM

## 2018-12-22 DIAGNOSIS — M9901 Segmental and somatic dysfunction of cervical region: Secondary | ICD-10-CM | POA: Diagnosis not present

## 2018-12-22 DIAGNOSIS — G44219 Episodic tension-type headache, not intractable: Secondary | ICD-10-CM | POA: Diagnosis not present

## 2018-12-22 DIAGNOSIS — M530 Cervicocranial syndrome: Secondary | ICD-10-CM | POA: Diagnosis not present

## 2018-12-29 DIAGNOSIS — M0609 Rheumatoid arthritis without rheumatoid factor, multiple sites: Secondary | ICD-10-CM | POA: Diagnosis not present

## 2018-12-29 DIAGNOSIS — R5382 Chronic fatigue, unspecified: Secondary | ICD-10-CM | POA: Diagnosis not present

## 2018-12-29 DIAGNOSIS — M150 Primary generalized (osteo)arthritis: Secondary | ICD-10-CM | POA: Diagnosis not present

## 2018-12-29 DIAGNOSIS — Z79899 Other long term (current) drug therapy: Secondary | ICD-10-CM | POA: Diagnosis not present

## 2019-01-01 ENCOUNTER — Ambulatory Visit
Admission: RE | Admit: 2019-01-01 | Discharge: 2019-01-01 | Disposition: A | Discharge status: Home / Self Care | Payer: BLUE CROSS/BLUE SHIELD | Source: Ambulatory Visit | Attending: Obstetrics & Gynecology | Admitting: Obstetrics & Gynecology

## 2019-01-01 DIAGNOSIS — Z1231 Encounter for screening mammogram for malignant neoplasm of breast: Secondary | ICD-10-CM

## 2019-01-02 ENCOUNTER — Encounter: Payer: Self-pay | Admitting: Obstetrics & Gynecology

## 2019-01-05 ENCOUNTER — Encounter: Payer: Self-pay | Admitting: Obstetrics & Gynecology

## 2019-01-05 ENCOUNTER — Ambulatory Visit (INDEPENDENT_AMBULATORY_CARE_PROVIDER_SITE_OTHER): Payer: BLUE CROSS/BLUE SHIELD | Admitting: Obstetrics & Gynecology

## 2019-01-05 VITALS — BP 140/80 | Ht 63.0 in | Wt 200.0 lb

## 2019-01-05 DIAGNOSIS — Z1211 Encounter for screening for malignant neoplasm of colon: Secondary | ICD-10-CM

## 2019-01-05 DIAGNOSIS — Z01419 Encounter for gynecological examination (general) (routine) without abnormal findings: Secondary | ICD-10-CM | POA: Diagnosis not present

## 2019-01-05 DIAGNOSIS — N3941 Urge incontinence: Secondary | ICD-10-CM

## 2019-01-05 NOTE — Progress Notes (Signed)
HPI:      [name] is a 58 y.o. [id number] who LMP was in the past (s/p ablation, menopause), she presents today for her annual examination.  The patient has no complaints today. The patient is sexually active. Herlast pap: approximate date 2019 and was normal and last mammogram: approximate date 2020 and was normal.  The patient does perform self breast exams.  There is no notable family history of breast or ovarian cancer in her family. The patient is NOT taking hormone replacement therapy. Patient denies post-menopausal vaginal bleeding.   The patient has regular exercise: yes. The patient denies current symptoms of depression.  Min bladder sx's at this time.  Recent labs done thru work, results reviewed.  Recent mild right breast T.  GYN Hx: Last Colonoscopy:Dr Vira Agar, 8-9years ago. Normal.   PMHx: Past Medical History:  Diagnosis Date  . Cancer ([place])    skin  . Rheumatoid arteritis ([place])    Past Surgical History:  Procedure Laterality Date  . ABLATION     Family History  Problem Relation Age of Onset  . Breast cancer Mother 76  . Colon cancer Nephew 43   Social History   Tobacco Use  . Smoking status: Current Every Day Smoker  . Smokeless tobacco: Never Used  Substance Use Topics  . Alcohol use: No    Frequency: Never  . Drug use: No    Current Outpatient Medications:  .  albuterol (PROVENTIL HFA;VENTOLIN HFA) 108 (90 Base) MCG/ACT inhaler, , Disp: , Rfl:  .  azithromycin (ZITHROMAX Z-PAK) 250 MG tablet, , Disp: , Rfl:  .  buPROPion (WELLBUTRIN XL) 150 MG 24 hr tablet, Take by mouth., Disp: , Rfl:  .  DUEXIS 800-26.6 MG TABS, Take 1 tablet by mouth 3 (three) times daily as needed., Disp: , Rfl: 2 .  gabapentin (NEURONTIN) 100 MG capsule, , Disp: , Rfl:  .  hydrochlorothiazide (HYDRODIURIL) 25 MG tablet, Take by mouth., Disp: , Rfl:  .  imipramine (TOFRANIL) 25 MG tablet, Take 25 mg by mouth., Disp: , Rfl:  .  metFORMIN (GLUCOPHAGE-XR) 500 MG 24 hr tablet, Take  by mouth., Disp: , Rfl:  .  oseltamivir (TAMIFLU) 75 MG capsule, TAKE ONE CAPSULE BY MOUTH ONCE DAILY FOR 10 DAYS, Disp: , Rfl: 0 .  pramipexole (MIRAPEX) 0.5 MG tablet, Take by mouth., Disp: , Rfl:  .  pramipexole (MIRAPEX) 1 MG tablet, Take 1 mg by mouth., Disp: , Rfl:  .  predniSONE (DELTASONE) 20 MG tablet, TAKE 2 TABLETS BY MOUTH DAILY FOR 3 DAYS, Disp: , Rfl: 0 .  XELJANZ XR 11 MG TB24, , Disp: , Rfl:  .  PARoxetine (PAXIL) 10 MG tablet, Take by mouth., Disp: , Rfl:   Current Facility-Administered Medications:  .  betamethasone acetate-betamethasone sodium phosphate (CELESTONE) injection 3 mg, 3 mg, Intramuscular, Once, Evans, Brent M, DPM Allergies: Cortisone; Penicillin g; Sulfa antibiotics; Sulfacetamide; and Penicillins  Review of Systems  Constitutional: Negative for chills, fever and malaise/fatigue.  HENT: Negative for congestion, sinus pain and sore throat.   Eyes: Negative for blurred vision and pain.  Respiratory: Negative for cough and wheezing.   Cardiovascular: Negative for chest pain and leg swelling.  Gastrointestinal: Negative for abdominal pain, constipation, diarrhea, heartburn, nausea and vomiting.  Genitourinary: Negative for dysuria, frequency, hematuria and urgency.  Musculoskeletal: Negative for back pain, joint pain, myalgias and neck pain.  Skin: Negative for itching and rash.  Neurological: Negative for dizziness, tremors and weakness.  Endo/Heme/Allergies: Does not bruise/bleed easily.  Psychiatric/Behavioral: Negative for depression. The patient is not nervous/anxious and does not have insomnia.     Objective: BP 140/80   Ht 5\' 3"  (1.6 m)   Wt 200 lb (90.7 kg)   BMI 35.43 kg/m   Filed Weights   01/05/19 0804  Weight: 200 lb (90.7 kg)   Body mass index is 35.43 kg/m. Physical Exam Constitutional:      General: She is not in acute distress.    Appearance: She is well-developed.  Genitourinary:     Pelvic exam was performed with patient  supine.     Vagina, uterus and rectum normal.     No lesions in the vagina.     No vaginal bleeding.     No cervical motion tenderness, friability, lesion or polyp.     Uterus is mobile.     Uterus is not enlarged.     No uterine mass detected.    Uterus is midaxial.     No right or left adnexal mass present.     Right adnexa not tender.     Left adnexa not tender.  HENT:     Head: Normocephalic and atraumatic. No laceration.     Right Ear: Hearing normal.     Left Ear: Hearing normal.     Mouth/Throat:     Pharynx: Uvula midline.  Eyes:     Pupils: Pupils are equal, round, and reactive to light.  Neck:     Musculoskeletal: Normal range of motion and neck supple.     Thyroid: No thyromegaly.  Cardiovascular:     Rate and Rhythm: Normal rate and regular rhythm.     Heart sounds: No murmur. No friction rub. No gallop.   Pulmonary:     Effort: Pulmonary effort is normal. No respiratory distress.     Breath sounds: Normal breath sounds. No wheezing.  Chest:     Breasts:        Right: No mass, skin change or tenderness.        Left: No mass, skin change or tenderness.  Abdominal:     General: Bowel sounds are normal. There is no distension.     Palpations: Abdomen is soft.     Tenderness: There is no abdominal tenderness. There is no rebound.  Musculoskeletal: Normal range of motion.  Neurological:     Mental Status: She is alert and oriented to person, place, and time.     Cranial Nerves: No cranial nerve deficit.  Skin:    General: Skin is warm and dry.  Psychiatric:        Judgment: Judgment normal.  Vitals signs reviewed.   Assessment: Annual Exam 1. Women's annual routine gynecological examination   2. Screen for colon cancer   3. Urge incontinence    Plan:            1.  Cervical Screening-  Pap smear schedule reviewed with patient  2. Breast screening- Exam annually and mammogram scheduled Exam and MMG normal.  Discussed causes of right breast  tenderness.  3. Colonoscopy every 10 years, Hemoccult testing after age 86  4. Labs managed by PCP  5. Counseling for hormonal therapy: none  6. Encouraged to take Vit D and calcium (Recent Vit D level 15)  7.  Encouraged weight loss and diet/exercise to manage hypercholesterolemia (recent total 260, LDL 165, HDL 67)    F/U  Return in about 1 year (around 01/06/2020) for Annual.  Barnett Applebaum, MD, [facility], [facility] 01/05/2019  8:10 AM

## 2019-01-05 NOTE — Patient Instructions (Signed)
PAP every three years Mammogram every year    Call [phone] to schedule at [facility] Colonoscopy every 10 years Labs yearly (with PCP) 

## 2019-01-15 DIAGNOSIS — M9901 Segmental and somatic dysfunction of cervical region: Secondary | ICD-10-CM | POA: Diagnosis not present

## 2019-01-15 DIAGNOSIS — M5387 Other specified dorsopathies, lumbosacral region: Secondary | ICD-10-CM | POA: Diagnosis not present

## 2019-01-15 DIAGNOSIS — M9908 Segmental and somatic dysfunction of rib cage: Secondary | ICD-10-CM | POA: Diagnosis not present

## 2019-01-15 DIAGNOSIS — M530 Cervicocranial syndrome: Secondary | ICD-10-CM | POA: Diagnosis not present

## 2019-04-02 DIAGNOSIS — M150 Primary generalized (osteo)arthritis: Secondary | ICD-10-CM | POA: Diagnosis not present

## 2019-04-02 DIAGNOSIS — R5382 Chronic fatigue, unspecified: Secondary | ICD-10-CM | POA: Diagnosis not present

## 2019-04-02 DIAGNOSIS — H1031 Unspecified acute conjunctivitis, right eye: Secondary | ICD-10-CM | POA: Diagnosis not present

## 2019-04-02 DIAGNOSIS — M0609 Rheumatoid arthritis without rheumatoid factor, multiple sites: Secondary | ICD-10-CM | POA: Diagnosis not present

## 2019-04-02 DIAGNOSIS — Z79899 Other long term (current) drug therapy: Secondary | ICD-10-CM | POA: Diagnosis not present

## 2019-05-15 DIAGNOSIS — M9902 Segmental and somatic dysfunction of thoracic region: Secondary | ICD-10-CM | POA: Diagnosis not present

## 2019-05-15 DIAGNOSIS — M25531 Pain in right wrist: Secondary | ICD-10-CM | POA: Diagnosis not present

## 2019-05-15 DIAGNOSIS — M9901 Segmental and somatic dysfunction of cervical region: Secondary | ICD-10-CM | POA: Diagnosis not present

## 2019-05-15 DIAGNOSIS — M531 Cervicobrachial syndrome: Secondary | ICD-10-CM | POA: Diagnosis not present

## 2019-06-14 DIAGNOSIS — G4733 Obstructive sleep apnea (adult) (pediatric): Secondary | ICD-10-CM | POA: Diagnosis not present

## 2019-06-18 DIAGNOSIS — Z79899 Other long term (current) drug therapy: Secondary | ICD-10-CM | POA: Diagnosis not present

## 2019-06-18 DIAGNOSIS — M0609 Rheumatoid arthritis without rheumatoid factor, multiple sites: Secondary | ICD-10-CM | POA: Diagnosis not present

## 2019-06-18 DIAGNOSIS — R5382 Chronic fatigue, unspecified: Secondary | ICD-10-CM | POA: Diagnosis not present

## 2019-06-18 DIAGNOSIS — M150 Primary generalized (osteo)arthritis: Secondary | ICD-10-CM | POA: Diagnosis not present

## 2019-08-14 DIAGNOSIS — M5387 Other specified dorsopathies, lumbosacral region: Secondary | ICD-10-CM | POA: Diagnosis not present

## 2019-08-14 DIAGNOSIS — M9901 Segmental and somatic dysfunction of cervical region: Secondary | ICD-10-CM | POA: Diagnosis not present

## 2019-08-14 DIAGNOSIS — M531 Cervicobrachial syndrome: Secondary | ICD-10-CM | POA: Diagnosis not present

## 2019-08-14 DIAGNOSIS — M9902 Segmental and somatic dysfunction of thoracic region: Secondary | ICD-10-CM | POA: Diagnosis not present

## 2019-09-10 DIAGNOSIS — Z79899 Other long term (current) drug therapy: Secondary | ICD-10-CM | POA: Diagnosis not present

## 2019-09-10 DIAGNOSIS — R5382 Chronic fatigue, unspecified: Secondary | ICD-10-CM | POA: Diagnosis not present

## 2019-09-10 DIAGNOSIS — M150 Primary generalized (osteo)arthritis: Secondary | ICD-10-CM | POA: Diagnosis not present

## 2019-09-10 DIAGNOSIS — M0609 Rheumatoid arthritis without rheumatoid factor, multiple sites: Secondary | ICD-10-CM | POA: Diagnosis not present

## 2019-09-12 DIAGNOSIS — G4733 Obstructive sleep apnea (adult) (pediatric): Secondary | ICD-10-CM | POA: Diagnosis not present

## 2019-11-08 DIAGNOSIS — M25562 Pain in left knee: Secondary | ICD-10-CM | POA: Diagnosis not present

## 2019-11-08 DIAGNOSIS — M150 Primary generalized (osteo)arthritis: Secondary | ICD-10-CM | POA: Diagnosis not present

## 2019-11-08 DIAGNOSIS — M0609 Rheumatoid arthritis without rheumatoid factor, multiple sites: Secondary | ICD-10-CM | POA: Diagnosis not present

## 2019-12-11 DIAGNOSIS — M9902 Segmental and somatic dysfunction of thoracic region: Secondary | ICD-10-CM | POA: Diagnosis not present

## 2019-12-11 DIAGNOSIS — M9901 Segmental and somatic dysfunction of cervical region: Secondary | ICD-10-CM | POA: Diagnosis not present

## 2019-12-11 DIAGNOSIS — M5382 Other specified dorsopathies, cervical region: Secondary | ICD-10-CM | POA: Diagnosis not present

## 2019-12-11 DIAGNOSIS — M4014 Other secondary kyphosis, thoracic region: Secondary | ICD-10-CM | POA: Diagnosis not present

## 2020-01-08 ENCOUNTER — Other Ambulatory Visit: Payer: Self-pay | Admitting: Obstetrics & Gynecology

## 2020-01-08 DIAGNOSIS — Z1231 Encounter for screening mammogram for malignant neoplasm of breast: Secondary | ICD-10-CM

## 2020-01-09 ENCOUNTER — Ambulatory Visit
Admission: RE | Admit: 2020-01-09 | Discharge: 2020-01-09 | Disposition: A | Discharge status: Home / Self Care | Payer: BC Managed Care – PPO | Source: Ambulatory Visit | Attending: Obstetrics & Gynecology | Admitting: Obstetrics & Gynecology

## 2020-01-09 ENCOUNTER — Encounter: Payer: Self-pay | Admitting: Obstetrics & Gynecology

## 2020-01-09 DIAGNOSIS — Z1231 Encounter for screening mammogram for malignant neoplasm of breast: Secondary | ICD-10-CM | POA: Diagnosis not present

## 2020-01-11 DIAGNOSIS — M25562 Pain in left knee: Secondary | ICD-10-CM | POA: Diagnosis not present

## 2020-01-11 DIAGNOSIS — M25532 Pain in left wrist: Secondary | ICD-10-CM | POA: Diagnosis not present

## 2020-01-21 DIAGNOSIS — M1712 Unilateral primary osteoarthritis, left knee: Secondary | ICD-10-CM | POA: Diagnosis not present

## 2020-01-28 DIAGNOSIS — M1712 Unilateral primary osteoarthritis, left knee: Secondary | ICD-10-CM | POA: Diagnosis not present

## 2020-01-30 DIAGNOSIS — M25562 Pain in left knee: Secondary | ICD-10-CM | POA: Diagnosis not present

## 2020-02-01 ENCOUNTER — Ambulatory Visit (INDEPENDENT_AMBULATORY_CARE_PROVIDER_SITE_OTHER): Payer: BC Managed Care – PPO | Admitting: Obstetrics & Gynecology

## 2020-02-01 ENCOUNTER — Encounter: Payer: Self-pay | Admitting: Obstetrics & Gynecology

## 2020-02-01 ENCOUNTER — Other Ambulatory Visit: Payer: Self-pay

## 2020-02-01 VITALS — BP 120/80 | Ht 63.0 in | Wt 219.0 lb

## 2020-02-01 DIAGNOSIS — Z01419 Encounter for gynecological examination (general) (routine) without abnormal findings: Secondary | ICD-10-CM | POA: Diagnosis not present

## 2020-02-01 DIAGNOSIS — Z1231 Encounter for screening mammogram for malignant neoplasm of breast: Secondary | ICD-10-CM

## 2020-02-01 DIAGNOSIS — Z1211 Encounter for screening for malignant neoplasm of colon: Secondary | ICD-10-CM

## 2020-02-01 NOTE — Patient Instructions (Addendum)
PAP every three years (due 2022) Mammogram every year (up to date) Colonoscopy every 10 years (due this year) Labs yearly (with PCP)  Primary Care in the area  This is not meant to be comprehensive list, but a resource for some providers that you can call for counseling or medication needs. If you have a recommendation, please let us know.   [facility]:  [phone]               Dr Miguel Aschoff               Dr Juanetta Beets               Dr Lavon Paganini  [facility]:  [phone]  Dr Drue Stager [facility], [place]:   [place], MD  Waunita Schooner, MD  Ria Bush, MD  Jinny Sanders, MD  [facility], [place]: [phone]  Orland Mustard, MD

## 2020-02-01 NOTE — Progress Notes (Signed)
HPI:      Cheryl Shepherd is a 59 y.o. [id number] who LMP was in the past (ablation and menopause), she presents today for her annual examination.  The patient has no complaints today other than stress over Covid and working from home, has also led to edema and weight gain.. The patient is sexually active. Herlast pap: approximate date 2019 and was normal and last mammogram: approximate date 01/2020 and was normal.  The patient does perform self breast exams.  There is no notable family history of breast or ovarian cancer in her family. The patient is not taking hormone replacement therapy. Patient denies post-menopausal vaginal bleeding.   The patient has regular exercise: yes. The patient denies current symptoms of depression.    GYN Hx: Last Colonoscopy:10 years ago. Normal.    PMHx: Past Medical History:  Diagnosis Date  . Anxiety   . Cancer ([place])    skin  . Hyperlipidemia   . Restless leg syndrome   . Rheumatoid arteritis ([place])    Past Surgical History:  Procedure Laterality Date  . ABLATION    . CARPECTOMY     right wrist  . CHOLECYSTECTOMY     Family History  Problem Relation Age of Onset  . Breast cancer Mother 60  . Colon cancer Nephew 43   Social History   Tobacco Use  . Smoking status: Current Every Day Smoker  . Smokeless tobacco: Never Used  Substance Use Topics  . Alcohol use: No  . Drug use: No    Current Outpatient Medications:  .  albuterol (PROVENTIL HFA;VENTOLIN HFA) 108 (90 Base) MCG/ACT inhaler, , Disp: , Rfl:  .  azithromycin (ZITHROMAX Z-PAK) 250 MG tablet, , Disp: , Rfl:  .  buPROPion (WELLBUTRIN XL) 150 MG 24 hr tablet, Take by mouth., Disp: , Rfl:  .  DUEXIS 800-26.6 MG TABS, Take 1 tablet by mouth 3 (three) times daily as needed., Disp: , Rfl: 2 .  gabapentin (NEURONTIN) 100 MG capsule, , Disp: , Rfl:  .  hydrochlorothiazide (HYDRODIURIL) 25 MG tablet, Take by mouth., Disp: , Rfl:  .  imipramine (TOFRANIL) 25 MG tablet, Take 25 mg by mouth.,  Disp: , Rfl:  .  metFORMIN (GLUCOPHAGE-XR) 500 MG 24 hr tablet, Take by mouth., Disp: , Rfl:  .  PARoxetine (PAXIL) 10 MG tablet, Take by mouth., Disp: , Rfl:  .  pramipexole (MIRAPEX) 0.5 MG tablet, Take by mouth., Disp: , Rfl:  .  pramipexole (MIRAPEX) 1 MG tablet, Take 1 mg by mouth., Disp: , Rfl:  .  predniSONE (DELTASONE) 20 MG tablet, TAKE 2 TABLETS BY MOUTH DAILY FOR 3 DAYS, Disp: , Rfl: 0 .  XELJANZ XR 11 MG TB24, , Disp: , Rfl:   Current Facility-Administered Medications:  .  betamethasone acetate-betamethasone sodium phosphate (CELESTONE) injection 3 mg, 3 mg, Intramuscular, Once, Evans, Brent M, DPM Allergies: Cortisone, Penicillin g, Sulfa antibiotics, Sulfacetamide, and Penicillins  Review of Systems  Constitutional: Positive for malaise/fatigue. Negative for chills and fever.  HENT: Negative for congestion, sinus pain and sore throat.   Eyes: Negative for blurred vision and pain.  Respiratory: Negative for cough and wheezing.   Cardiovascular: Positive for leg swelling. Negative for chest pain.  Gastrointestinal: Negative for abdominal pain, constipation, diarrhea, heartburn, nausea and vomiting.  Genitourinary: Negative for dysuria, frequency, hematuria and urgency.  Musculoskeletal: Negative for back pain, joint pain, myalgias and neck pain.  Skin: Negative for itching and rash.  Neurological: Negative for dizziness, tremors  and weakness.  Endo/Heme/Allergies: Does not bruise/bleed easily.  Psychiatric/Behavioral: Negative for depression. The patient is nervous/anxious. The patient does not have insomnia.     Objective: BP 120/80   Ht 5\' 3"  (1.6 m)   Wt 219 lb (99.3 kg)   BMI 38.79 kg/m   Filed Weights   02/01/20 0759  Weight: 219 lb (99.3 kg)   Body mass index is 38.79 kg/m. Physical Exam Constitutional:      General: She is not in acute distress.    Appearance: She is well-developed.  Genitourinary:     Pelvic exam was performed with patient supine.      Vagina, uterus and rectum normal.     No lesions in the vagina.     No vaginal bleeding.     No cervical motion tenderness, friability, lesion or polyp.     Uterus is mobile.     Uterus is not enlarged.     No uterine mass detected.    Uterus is midaxial.     No right or left adnexal mass present.     Right adnexa not tender.     Left adnexa not tender.  HENT:     Head: Normocephalic and atraumatic. No laceration.     Right Ear: Hearing normal.     Left Ear: Hearing normal.     Mouth/Throat:     Pharynx: Uvula midline.  Eyes:     Pupils: Pupils are equal, round, and reactive to light.  Neck:     Thyroid: No thyromegaly.  Cardiovascular:     Rate and Rhythm: Normal rate and regular rhythm.     Heart sounds: No murmur. No friction rub. No gallop.   Pulmonary:     Effort: Pulmonary effort is normal. No respiratory distress.     Breath sounds: Normal breath sounds. No wheezing.  Chest:     Breasts:        Right: No mass, skin change or tenderness.        Left: No mass, skin change or tenderness.  Abdominal:     General: Bowel sounds are normal. There is no distension.     Palpations: Abdomen is soft.     Tenderness: There is no abdominal tenderness. There is no rebound.  Musculoskeletal:        General: Normal range of motion.     Cervical back: Normal range of motion and neck supple.  Neurological:     Mental Status: She is alert and oriented to person, place, and time.     Cranial Nerves: No cranial nerve deficit.  Skin:    General: Skin is warm and dry.  Psychiatric:        Judgment: Judgment normal.  Vitals reviewed.    Assessment: Annual Exam 1. Women's annual routine gynecological examination   2. Encounter for screening mammogram for malignant neoplasm of breast   3. Screen for colon cancer    Plan:            1.  Cervical Screening-  Pap smear schedule reviewed with patient, due 2022  2. Breast screening- Exam annually and mammogram scheduled, UTD this  month and normal  3. Colonoscopy every 10 years, Hemoccult testing after age 42, due for repeat colonoscopy this year (last one done at [facility]) Also concerned as brother is being treated for colon cancer currently  4. Labs managed by PCP  List given to find new PCP  5. Counseling for hormonal therapy: none  6. FRAX - FRAX score for assessing the 10 year probability for fracture calculated and discussed today.  Based on age and score today, DEXA is not currently scheduled.    F/U  Return in about 1 year (around 01/31/2021) for Annual.  Barnett Applebaum, MD, [facility], [facility] 02/01/2020  8:01 AM

## 2020-02-04 ENCOUNTER — Telehealth: Payer: Self-pay

## 2020-02-04 ENCOUNTER — Other Ambulatory Visit: Payer: Self-pay

## 2020-02-04 DIAGNOSIS — Z800 Family history of malignant neoplasm of digestive organs: Secondary | ICD-10-CM

## 2020-02-04 DIAGNOSIS — Z1211 Encounter for screening for malignant neoplasm of colon: Secondary | ICD-10-CM

## 2020-02-04 DIAGNOSIS — M1712 Unilateral primary osteoarthritis, left knee: Secondary | ICD-10-CM | POA: Diagnosis not present

## 2020-02-04 NOTE — Telephone Encounter (Signed)
Gastroenterology Pre-Procedure Review  Request Date: Friday 02/22/20 Requesting Physician: Dr. Marius Ditch  PATIENT REVIEW QUESTIONS: The patient responded to the following health history questions as indicated:    1. Are you having any GI issues? no 2. Do you have a personal history of Polyps? no 3. Do you have a family history of Colon Cancer or Polyps? yes (brother diagnosed last year with colon cancer, nephew had colon cancer) 4. Diabetes Mellitus? no 5. Joint replacements in the past 12 months?No 6. Major health problems in the past 3 months?no 7. Any artificial heart valves, MVP, or defibrillator?no    MEDICATIONS & ALLERGIES:    Patient reports the following regarding taking any anticoagulation/antiplatelet therapy:   Plavix, Coumadin, Eliquis, Xarelto, Lovenox, Pradaxa, Brilinta, or Effient? no Aspirin? no  Patient confirms/reports the following medications:  Current Outpatient Medications  Medication Sig Dispense Refill  . albuterol (PROVENTIL HFA;VENTOLIN HFA) 108 (90 Base) MCG/ACT inhaler     . azithromycin (ZITHROMAX Z-PAK) 250 MG tablet     . buPROPion (WELLBUTRIN XL) 150 MG 24 hr tablet Take by mouth.    . Dietary Management Product (RHEUMATE) CAPS Take 1 capsule by mouth daily.    . DUEXIS 800-26.6 MG TABS Take 1 tablet by mouth 3 (three) times daily as needed.  2  . gabapentin (NEURONTIN) 100 MG capsule     . hydrochlorothiazide (HYDRODIURIL) 25 MG tablet Take by mouth.    Marland Kitchen imipramine (TOFRANIL) 25 MG tablet Take 25 mg by mouth.    . metFORMIN (GLUCOPHAGE-XR) 500 MG 24 hr tablet Take by mouth.    Marland Kitchen PARoxetine (PAXIL) 10 MG tablet Take by mouth.    . pramipexole (MIRAPEX) 0.5 MG tablet Take by mouth.    . pramipexole (MIRAPEX) 1 MG tablet Take 1 mg by mouth.    . predniSONE (DELTASONE) 20 MG tablet TAKE 2 TABLETS BY MOUTH DAILY FOR 3 DAYS  0  . XELJANZ XR 11 MG TB24      Current Facility-Administered Medications  Medication Dose Route Frequency Provider Last Rate Last  Admin  . betamethasone acetate-betamethasone sodium phosphate (CELESTONE) injection 3 mg  3 mg Intramuscular Once Edrick Kins, DPM        Patient confirms/reports the following allergies:  Allergies  Allergen Reactions  . Cortisone Swelling, Hives and Other (See Comments)    Flushing  . Penicillin G Other (See Comments)  . Sulfa Antibiotics Nausea Only and Other (See Comments)  . Sulfacetamide     Other reaction(s): NAUSEA  . Penicillins Rash and Other (See Comments)    No orders of the defined types were placed in this encounter.   AUTHORIZATION INFORMATION Primary Insurance: 1D#: Group #:  Secondary Insurance: 1D#: Group #:  SCHEDULE INFORMATION: Date: Friday 02/22/20 Time: Location:[facility]

## 2020-02-04 NOTE — Telephone Encounter (Signed)
Pt LVM on Friday afternoon in regards to scheduling her colonoscopy.  Returned call and LVM for her to call office back to schedule.  Thanks, [place], [place]

## 2020-02-08 DIAGNOSIS — E669 Obesity, unspecified: Secondary | ICD-10-CM | POA: Diagnosis not present

## 2020-02-08 DIAGNOSIS — M25562 Pain in left knee: Secondary | ICD-10-CM | POA: Diagnosis not present

## 2020-02-08 DIAGNOSIS — M0609 Rheumatoid arthritis without rheumatoid factor, multiple sites: Secondary | ICD-10-CM | POA: Diagnosis not present

## 2020-02-11 DIAGNOSIS — M1712 Unilateral primary osteoarthritis, left knee: Secondary | ICD-10-CM | POA: Diagnosis not present

## 2020-02-20 ENCOUNTER — Other Ambulatory Visit: Payer: Self-pay

## 2020-02-20 ENCOUNTER — Other Ambulatory Visit
Admission: RE | Admit: 2020-02-20 | Discharge: 2020-02-20 | Disposition: A | Discharge status: Home / Self Care | Payer: BC Managed Care – PPO | Source: Ambulatory Visit | Attending: Gastroenterology | Admitting: Gastroenterology

## 2020-02-20 DIAGNOSIS — Z01812 Encounter for preprocedural laboratory examination: Secondary | ICD-10-CM | POA: Diagnosis not present

## 2020-02-20 DIAGNOSIS — Z20822 Contact with and (suspected) exposure to covid-19: Secondary | ICD-10-CM | POA: Insufficient documentation

## 2020-02-20 LAB — SARS CORONAVIRUS 2 (TAT 6-24 HRS): SARS Coronavirus 2: NEGATIVE

## 2020-02-22 ENCOUNTER — Encounter: Payer: Self-pay | Admitting: Gastroenterology

## 2020-02-22 ENCOUNTER — Ambulatory Visit
Admission: RE | Admit: 2020-02-22 | Discharge: 2020-02-22 | Disposition: A | Discharge status: Home / Self Care | Payer: BC Managed Care – PPO | Attending: Gastroenterology | Admitting: Gastroenterology

## 2020-02-22 ENCOUNTER — Encounter
Admission: RE | Disposition: A | Discharge status: Home / Self Care | Payer: Self-pay | Source: Home / Self Care | Attending: Gastroenterology

## 2020-02-22 ENCOUNTER — Ambulatory Visit: Payer: BC Managed Care – PPO | Admitting: Registered Nurse

## 2020-02-22 DIAGNOSIS — Z7984 Long term (current) use of oral hypoglycemic drugs: Secondary | ICD-10-CM | POA: Diagnosis not present

## 2020-02-22 DIAGNOSIS — Z79899 Other long term (current) drug therapy: Secondary | ICD-10-CM | POA: Insufficient documentation

## 2020-02-22 DIAGNOSIS — E119 Type 2 diabetes mellitus without complications: Secondary | ICD-10-CM | POA: Diagnosis not present

## 2020-02-22 DIAGNOSIS — E785 Hyperlipidemia, unspecified: Secondary | ICD-10-CM | POA: Insufficient documentation

## 2020-02-22 DIAGNOSIS — G4730 Sleep apnea, unspecified: Secondary | ICD-10-CM | POA: Insufficient documentation

## 2020-02-22 DIAGNOSIS — F419 Anxiety disorder, unspecified: Secondary | ICD-10-CM | POA: Insufficient documentation

## 2020-02-22 DIAGNOSIS — G2581 Restless legs syndrome: Secondary | ICD-10-CM | POA: Insufficient documentation

## 2020-02-22 DIAGNOSIS — I10 Essential (primary) hypertension: Secondary | ICD-10-CM | POA: Insufficient documentation

## 2020-02-22 DIAGNOSIS — M1990 Unspecified osteoarthritis, unspecified site: Secondary | ICD-10-CM | POA: Diagnosis not present

## 2020-02-22 DIAGNOSIS — Z800 Family history of malignant neoplasm of digestive organs: Secondary | ICD-10-CM

## 2020-02-22 DIAGNOSIS — F17200 Nicotine dependence, unspecified, uncomplicated: Secondary | ICD-10-CM | POA: Diagnosis not present

## 2020-02-22 DIAGNOSIS — Z1211 Encounter for screening for malignant neoplasm of colon: Secondary | ICD-10-CM | POA: Diagnosis not present

## 2020-02-22 DIAGNOSIS — J449 Chronic obstructive pulmonary disease, unspecified: Secondary | ICD-10-CM | POA: Insufficient documentation

## 2020-02-22 DIAGNOSIS — K644 Residual hemorrhoidal skin tags: Secondary | ICD-10-CM | POA: Insufficient documentation

## 2020-02-22 DIAGNOSIS — K5790 Diverticulosis of intestine, part unspecified, without perforation or abscess without bleeding: Secondary | ICD-10-CM | POA: Diagnosis not present

## 2020-02-22 DIAGNOSIS — Z1331 Encounter for screening for depression: Secondary | ICD-10-CM

## 2020-02-22 HISTORY — PX: COLONOSCOPY WITH PROPOFOL: SHX5780

## 2020-02-22 LAB — HM COLONOSCOPY

## 2020-02-22 SURGERY — COLONOSCOPY WITH PROPOFOL
Anesthesia: General

## 2020-02-22 MED ORDER — PROPOFOL 500 MG/50ML IV EMUL
INTRAVENOUS | Status: DC | PRN
  Administered 2020-02-22: 125 ug/kg/min via INTRAVENOUS

## 2020-02-22 MED ORDER — SODIUM CHLORIDE 0.9 % IV SOLN
INTRAVENOUS | Status: DC
  Administered 2020-02-22: 1000 mL via INTRAVENOUS

## 2020-02-22 MED ORDER — EPHEDRINE SULFATE 50 MG/ML IJ SOLN
INTRAMUSCULAR | Status: DC | PRN
  Administered 2020-02-22: 10 mg via INTRAVENOUS

## 2020-02-22 MED ORDER — EPHEDRINE 5 MG/ML INJ
INTRAVENOUS | Status: AC
  Filled 2020-02-22: qty 10

## 2020-02-22 MED ORDER — DEXMEDETOMIDINE HCL 200 MCG/2ML IV SOLN
INTRAVENOUS | Status: DC | PRN
  Administered 2020-02-22: 12 ug via INTRAVENOUS

## 2020-02-22 MED ORDER — PHENYLEPHRINE HCL (PRESSORS) 10 MG/ML IV SOLN
INTRAVENOUS | Status: DC | PRN
  Administered 2020-02-22: 100 ug via INTRAVENOUS

## 2020-02-22 MED ORDER — LIDOCAINE HCL (PF) 1 % IJ SOLN
INTRAMUSCULAR | Status: AC
  Filled 2020-02-22: qty 2

## 2020-02-22 MED ORDER — PROPOFOL 10 MG/ML IV BOLUS
INTRAVENOUS | Status: DC | PRN
  Administered 2020-02-22: 80 mg via INTRAVENOUS
  Administered 2020-02-22: 30 mg via INTRAVENOUS

## 2020-02-22 MED ORDER — PROPOFOL 500 MG/50ML IV EMUL
INTRAVENOUS | Status: AC
  Filled 2020-02-22: qty 100

## 2020-02-22 NOTE — H&P (Signed)
Cheryl Darby, MD [address]  [place]  [place], [place] [address]  Main: [phone]  Fax: [phone] Pager: [phone]  Primary Care Physician:  Gae Dry, MD Primary Gastroenterologist:  Dr. Cephas Shepherd  Pre-Procedure History & Physical: HPI:  Cheryl Shepherd is a 59 y.o. female is here for an colonoscopy.   Past Medical History:  Diagnosis Date  . Anxiety   . Cancer ([place])    skin  . Hyperlipidemia   . Restless leg syndrome   . Rheumatoid arteritis ([place])     Past Surgical History:  Procedure Laterality Date  . ABLATION    . CARPECTOMY     right wrist  . CHOLECYSTECTOMY      Prior to Admission medications   Medication Sig Start Date End Date Taking? Authorizing Provider  Dietary Management Product (RHEUMATE) CAPS Take 1 capsule by mouth daily. 01/09/20  Yes [provider]  DUEXIS 800-26.6 MG TABS Take 1 tablet by mouth 3 (three) times daily as needed. 02/14/17  Yes [provider]  gabapentin (NEURONTIN) 100 MG capsule  12/14/17  Yes [provider]  hydrochlorothiazide (HYDRODIURIL) 25 MG tablet Take by mouth.   Yes [provider]  XELJANZ XR 11 MG TB24  01/02/19  Yes [provider]  albuterol (PROVENTIL HFA;VENTOLIN HFA) 108 (90 Base) MCG/ACT inhaler  11/30/16   [provider]  azithromycin (ZITHROMAX Z-PAK) 250 MG tablet  11/30/16   [provider]  buPROPion (WELLBUTRIN XL) 150 MG 24 hr tablet Take by mouth. 06/29/16   [provider]  imipramine (TOFRANIL) 25 MG tablet Take 25 mg by mouth. 04/02/13   [provider]  metFORMIN (GLUCOPHAGE-XR) 500 MG 24 hr tablet Take by mouth.    [provider]  PARoxetine (PAXIL) 10 MG tablet Take by mouth. 06/29/16 06/29/17  [provider]  pramipexole (MIRAPEX) 0.5 MG tablet Take by mouth.    [provider]  pramipexole (MIRAPEX) 1 MG tablet Take 1 mg by mouth. 01/29/13   [provider]    predniSONE (DELTASONE) 20 MG tablet TAKE 2 TABLETS BY MOUTH DAILY FOR 3 DAYS 12/05/16   [provider]    Allergies as of 02/04/2020 - Review Complete 02/01/2020  Allergen Reaction Noted  . Cortisone Swelling, Hives, and Other (See Comments) 04/02/2013  . Penicillin g Other (See Comments)   . Sulfa antibiotics Nausea Only and Other (See Comments) 03/28/2014  . Sulfacetamide  04/02/2013  . Penicillins Rash and Other (See Comments) 04/02/2013    Family History  Problem Relation Age of Onset  . Breast cancer Mother 50  . Colon cancer Nephew 44    Social History   Socioeconomic History  . Marital status: Married    Spouse name: Not on file  . Number of children: Not on file  . Years of education: Not on file  . Highest education level: Not on file  Occupational History  . Not on file  Tobacco Use  . Smoking status: Current Every Day Smoker  . Smokeless tobacco: Never Used  Substance and Sexual Activity  . Alcohol use: No  . Drug use: No  . Sexual activity: Yes  Other Topics Concern  . Not on file  Social History Narrative  . Not on file   Social Determinants of Health   Financial Resource Strain:   . Difficulty of Paying Living Expenses:   Food Insecurity:   . Worried About Charity fundraiser in the  Last Year:   . [place] in the Last Year:   Transportation Needs:   . Film/video editor (Medical):   Marland Kitchen Lack of Transportation (Non-Medical):   Physical Activity:   . Days of Exercise per Week:   . Minutes of Exercise per Session:   Stress:   . Feeling of Stress :   Social Connections:   . Frequency of Communication with Friends and Family:   . Frequency of Social Gatherings with Friends and Family:   . Attends Religious Services:   . Active Member of Clubs or Organizations:   . Attends Archivist Meetings:   Marland Kitchen Marital Status:   Intimate Partner Violence:   . Fear of Current or Ex-Partner:   . Emotionally Abused:   Marland Kitchen Physically  Abused:   . Sexually Abused:     Review of Systems: See HPI, otherwise negative ROS  Physical Exam: BP (!) 151/79   Pulse 82   Temp (!) 96.9 F (36.1 C) (Temporal)   Resp 18   Ht 5\' 3"  (1.6 m)   Wt 95.5 kg   SpO2 98%   BMI 37.28 kg/m  General:   Alert,  pleasant and cooperative in NAD Head:  Normocephalic and atraumatic. Neck:  Supple; no masses or thyromegaly. Lungs:  Clear throughout to auscultation.    Heart:  Regular rate and rhythm. Abdomen:  Soft, nontender and nondistended. Normal bowel sounds, without guarding, and without rebound.   Neurologic:  Alert and  oriented x4;  grossly normal neurologically.  Impression/Plan: Cheryl Shepherd is here for an colonoscopy to be performed for colon cancer screening  Risks, benefits, limitations, and alternatives regarding  colonoscopy have been reviewed with the patient.  Questions have been answered.  All parties agreeable.   Sherri Sear, MD  02/22/2020, 10:52 AM

## 2020-02-22 NOTE — Op Note (Signed)
[facility] Gastroenterology Patient Name: Cheryl Shepherd Procedure Date: 02/22/2020 11:26 AM MRN: [id number] Account #: [id number] Date of Birth: Mar 31, 1961 Admit Type: Outpatient Age: 59 Room: [facility] ENDO ROOM 3 Gender: Female Note Status: Finalized Procedure:             Colonoscopy Indications:           Screening patient at increased risk: Family history of                         1st-degree relative with colorectal cancer at age 9                         years (or older), Last colonoscopy: November 2013 Providers:             Lin Landsman MD, MD Medicines:             Monitored Anesthesia Care Complications:         No immediate complications. Estimated blood loss: None. Procedure:             Pre-Anesthesia Assessment:                        - Prior to the procedure, a History and Physical was                         performed, and patient medications and allergies were                         reviewed. The patient is competent. The risks and                         benefits of the procedure and the sedation options and                         risks were discussed with the patient. All questions                         were answered and informed consent was obtained.                         Patient identification and proposed procedure were                         verified by the physician, the nurse, the                         anesthesiologist, the anesthetist and the technician                         in the pre-procedure area in the procedure room in the                         endoscopy suite. Mental Status Examination: alert and                         oriented. Airway Examination: normal oropharyngeal  airway and neck mobility. Respiratory Examination:                         clear to auscultation. CV Examination: normal.                         Prophylactic Antibiotics: The patient does not require   prophylactic antibiotics. Prior Anticoagulants: The                         patient has taken no previous anticoagulant or                         antiplatelet agents. ASA Grade Assessment: III - A                         patient with severe systemic disease. After reviewing                         the risks and benefits, the patient was deemed in                         satisfactory condition to undergo the procedure. The                         anesthesia plan was to use monitored anesthesia care                         (MAC). Immediately prior to administration of                         medications, the patient was re-assessed for adequacy                         to receive sedatives. The heart rate, respiratory                         rate, oxygen saturations, blood pressure, adequacy of                         pulmonary ventilation, and response to care were                         monitored throughout the procedure. The physical                         status of the patient was re-assessed after the                         procedure.                        After obtaining informed consent, the colonoscope was                         passed under direct vision. Throughout the procedure,                         the patient's blood pressure, pulse, and oxygen  saturations were monitored continuously. The                         Colonoscope was introduced through the anus and                         advanced to the the cecum, identified by appendiceal                         orifice and ileocecal valve. The colonoscopy was                         performed without difficulty. The patient tolerated                         the procedure well. The quality of the bowel                         preparation was evaluated using the BBPS [facility] Bowel                         Preparation Scale) with scores of: Right Colon = 3,                         Transverse Colon = 3 and  Left Colon = 3 (entire mucosa                         seen well with no residual staining, small fragments                         of stool or opaque liquid). The total BBPS score                         equals 9. Findings:      The entire examined colon appeared normal.      The retroflexed view of the distal rectum and anal verge was normal and       showed no anal or rectal abnormalities.      Skin tags were found on perianal exam. Impression:            - The entire examined colon is normal.                        - The distal rectum and anal verge are normal on                         retroflexion view.                        - Perianal skin tags found on perianal exam.                        - No specimens collected. Recommendation:        - Discharge patient to home (with escort).                        - Resume previous diet today.                        -  Continue present medications.                        - Repeat colonoscopy in 5 years for surveillance. Procedure Code(s):     --- Professional ---                        B7493, Colorectal cancer screening; colonoscopy on                         individual at high risk Diagnosis Code(s):     --- Professional ---                        Z80.0, Family history of malignant neoplasm of                         digestive organs                        K64.4, Residual hemorrhoidal skin tags CPT copyright 2019 American Medical Association. All rights reserved. The codes documented in this report are preliminary and upon coder review may  be revised to meet current compliance requirements. Dr. Ulyess Mort Lin Landsman MD, MD 02/22/2020 12:40:37 PM This report has been signed electronically. Number of Addenda: 0 Note Initiated On: 02/22/2020 11:26 AM Scope Withdrawal Time: 0 hours 10 minutes 44 seconds  Total Procedure Duration: 0 hours 13 minutes 6 seconds  Estimated Blood Loss:  Estimated blood loss: none.      [facility]

## 2020-02-22 NOTE — Anesthesia Preprocedure Evaluation (Signed)
Anesthesia Evaluation  Patient identified by MRN, date of birth, ID band Patient awake    Reviewed: Allergy & Precautions, NPO status , Patient's Chart, lab work & pertinent test results  Airway Mallampati: III       Dental   Pulmonary sleep apnea , COPD, Current Smoker,    Pulmonary exam normal        Cardiovascular hypertension, Normal cardiovascular exam     Neuro/Psych Anxiety negative neurological ROS     GI/Hepatic negative GI ROS, Neg liver ROS,   Endo/Other  diabetes  Renal/GU negative Renal ROS Bladder dysfunction      Musculoskeletal  (+) Arthritis ,   Abdominal Normal abdominal exam  (+)   Peds negative pediatric ROS (+)  Hematology   Anesthesia Other Findings   Reproductive/Obstetrics                             Anesthesia Physical Anesthesia Plan  ASA: III  Anesthesia Plan: General   Post-op Pain Management:    Induction: Intravenous  PONV Risk Score and Plan:   Airway Management Planned: Nasal Cannula  Additional Equipment:   Intra-op Plan:   Post-operative Plan:   Informed Consent: I have reviewed the patients History and Physical, chart, labs and discussed the procedure including the risks, benefits and alternatives for the proposed anesthesia with the patient or authorized representative who has indicated his/her understanding and acceptance.     Dental advisory given  Plan Discussed with: CRNA and Surgeon  Anesthesia Plan Comments:         Anesthesia Quick Evaluation

## 2020-02-22 NOTE — Transfer of Care (Signed)
Immediate Anesthesia Transfer of Care Note  Patient: Cheryl Shepherd  Procedure(s) Performed: COLONOSCOPY WITH PROPOFOL (N/A )  Patient Location: Endoscopy Unit  Anesthesia Type:General  Level of Consciousness: drowsy  Airway & Oxygen Therapy: Patient Spontanous Breathing  Post-op Assessment: Report given to RN and Post -op Vital signs reviewed and stable  Post vital signs: Reviewed and stable  Last Vitals:  Vitals Value Taken Time  BP    Temp    Pulse    Resp    SpO2      Last Pain:  Vitals:   02/22/20 1035  TempSrc: Temporal  PainSc: 6          Complications: No apparent anesthesia complications

## 2020-02-22 NOTE — Anesthesia Postprocedure Evaluation (Signed)
Anesthesia Post Note  Patient: Cheryl Shepherd  Procedure(s) Performed: COLONOSCOPY WITH PROPOFOL (N/A )  Anesthesia Type: General Level of consciousness: awake and alert and oriented Pain management: pain level controlled Vital Signs Assessment: post-procedure vital signs reviewed and stable Respiratory status: spontaneous breathing Cardiovascular status: blood pressure returned to baseline Anesthetic complications: no     Last Vitals:  Vitals:   02/22/20 1300 02/22/20 1310  BP: 114/68 129/73  Pulse: 77 73  Resp: 18 17  Temp:    SpO2: 96% 96%    Last Pain:  Vitals:   02/22/20 1310  TempSrc:   PainSc: 0-No pain                 Teigen Bellin

## 2020-02-25 ENCOUNTER — Encounter: Payer: Self-pay | Admitting: *Deleted

## 2020-02-29 DIAGNOSIS — R58 Hemorrhage, not elsewhere classified: Secondary | ICD-10-CM | POA: Diagnosis not present

## 2020-02-29 DIAGNOSIS — D2271 Melanocytic nevi of right lower limb, including hip: Secondary | ICD-10-CM | POA: Diagnosis not present

## 2020-02-29 DIAGNOSIS — D225 Melanocytic nevi of trunk: Secondary | ICD-10-CM | POA: Diagnosis not present

## 2020-02-29 DIAGNOSIS — L910 Hypertrophic scar: Secondary | ICD-10-CM | POA: Diagnosis not present

## 2020-02-29 DIAGNOSIS — D2262 Melanocytic nevi of left upper limb, including shoulder: Secondary | ICD-10-CM | POA: Diagnosis not present

## 2020-02-29 DIAGNOSIS — L538 Other specified erythematous conditions: Secondary | ICD-10-CM | POA: Diagnosis not present

## 2020-02-29 DIAGNOSIS — D2261 Melanocytic nevi of right upper limb, including shoulder: Secondary | ICD-10-CM | POA: Diagnosis not present

## 2020-03-13 DIAGNOSIS — M9903 Segmental and somatic dysfunction of lumbar region: Secondary | ICD-10-CM | POA: Diagnosis not present

## 2020-03-13 DIAGNOSIS — M9902 Segmental and somatic dysfunction of thoracic region: Secondary | ICD-10-CM | POA: Diagnosis not present

## 2020-03-13 DIAGNOSIS — M9901 Segmental and somatic dysfunction of cervical region: Secondary | ICD-10-CM | POA: Diagnosis not present

## 2020-03-13 DIAGNOSIS — M542 Cervicalgia: Secondary | ICD-10-CM | POA: Diagnosis not present

## 2020-03-19 DIAGNOSIS — M9903 Segmental and somatic dysfunction of lumbar region: Secondary | ICD-10-CM | POA: Diagnosis not present

## 2020-03-19 DIAGNOSIS — M542 Cervicalgia: Secondary | ICD-10-CM | POA: Diagnosis not present

## 2020-03-19 DIAGNOSIS — M9901 Segmental and somatic dysfunction of cervical region: Secondary | ICD-10-CM | POA: Diagnosis not present

## 2020-03-19 DIAGNOSIS — M9902 Segmental and somatic dysfunction of thoracic region: Secondary | ICD-10-CM | POA: Diagnosis not present

## 2020-04-02 DIAGNOSIS — R509 Fever, unspecified: Secondary | ICD-10-CM | POA: Diagnosis not present

## 2020-04-03 DIAGNOSIS — R21 Rash and other nonspecific skin eruption: Secondary | ICD-10-CM | POA: Diagnosis not present

## 2020-04-21 DIAGNOSIS — M25562 Pain in left knee: Secondary | ICD-10-CM | POA: Diagnosis not present

## 2020-04-21 DIAGNOSIS — M1712 Unilateral primary osteoarthritis, left knee: Secondary | ICD-10-CM | POA: Diagnosis not present

## 2020-04-21 DIAGNOSIS — Z0189 Encounter for other specified special examinations: Secondary | ICD-10-CM | POA: Diagnosis not present

## 2020-04-21 DIAGNOSIS — R309 Painful micturition, unspecified: Secondary | ICD-10-CM | POA: Diagnosis not present

## 2020-04-21 DIAGNOSIS — M0609 Rheumatoid arthritis without rheumatoid factor, multiple sites: Secondary | ICD-10-CM | POA: Diagnosis not present

## 2020-04-21 DIAGNOSIS — Z111 Encounter for screening for respiratory tuberculosis: Secondary | ICD-10-CM | POA: Diagnosis not present

## 2020-04-21 DIAGNOSIS — M150 Primary generalized (osteo)arthritis: Secondary | ICD-10-CM | POA: Diagnosis not present

## 2020-04-21 DIAGNOSIS — R5382 Chronic fatigue, unspecified: Secondary | ICD-10-CM | POA: Diagnosis not present

## 2020-04-25 DIAGNOSIS — N390 Urinary tract infection, site not specified: Secondary | ICD-10-CM | POA: Diagnosis not present

## 2020-06-18 DIAGNOSIS — M542 Cervicalgia: Secondary | ICD-10-CM | POA: Diagnosis not present

## 2020-06-18 DIAGNOSIS — M9904 Segmental and somatic dysfunction of sacral region: Secondary | ICD-10-CM | POA: Diagnosis not present

## 2020-06-18 DIAGNOSIS — M9901 Segmental and somatic dysfunction of cervical region: Secondary | ICD-10-CM | POA: Diagnosis not present

## 2020-06-18 DIAGNOSIS — M9905 Segmental and somatic dysfunction of pelvic region: Secondary | ICD-10-CM | POA: Diagnosis not present

## 2020-06-20 DIAGNOSIS — M9904 Segmental and somatic dysfunction of sacral region: Secondary | ICD-10-CM | POA: Diagnosis not present

## 2020-06-20 DIAGNOSIS — M542 Cervicalgia: Secondary | ICD-10-CM | POA: Diagnosis not present

## 2020-06-20 DIAGNOSIS — M9905 Segmental and somatic dysfunction of pelvic region: Secondary | ICD-10-CM | POA: Diagnosis not present

## 2020-06-20 DIAGNOSIS — M9901 Segmental and somatic dysfunction of cervical region: Secondary | ICD-10-CM | POA: Diagnosis not present

## 2020-06-25 DIAGNOSIS — M9905 Segmental and somatic dysfunction of pelvic region: Secondary | ICD-10-CM | POA: Diagnosis not present

## 2020-06-25 DIAGNOSIS — M542 Cervicalgia: Secondary | ICD-10-CM | POA: Diagnosis not present

## 2020-06-25 DIAGNOSIS — M9904 Segmental and somatic dysfunction of sacral region: Secondary | ICD-10-CM | POA: Diagnosis not present

## 2020-06-25 DIAGNOSIS — M9901 Segmental and somatic dysfunction of cervical region: Secondary | ICD-10-CM | POA: Diagnosis not present

## 2020-07-02 DIAGNOSIS — M9905 Segmental and somatic dysfunction of pelvic region: Secondary | ICD-10-CM | POA: Diagnosis not present

## 2020-07-02 DIAGNOSIS — M9901 Segmental and somatic dysfunction of cervical region: Secondary | ICD-10-CM | POA: Diagnosis not present

## 2020-07-02 DIAGNOSIS — M542 Cervicalgia: Secondary | ICD-10-CM | POA: Diagnosis not present

## 2020-07-02 DIAGNOSIS — M9904 Segmental and somatic dysfunction of sacral region: Secondary | ICD-10-CM | POA: Diagnosis not present

## 2020-07-21 DIAGNOSIS — M542 Cervicalgia: Secondary | ICD-10-CM | POA: Diagnosis not present

## 2020-07-21 DIAGNOSIS — M9901 Segmental and somatic dysfunction of cervical region: Secondary | ICD-10-CM | POA: Diagnosis not present

## 2020-07-21 DIAGNOSIS — M9905 Segmental and somatic dysfunction of pelvic region: Secondary | ICD-10-CM | POA: Diagnosis not present

## 2020-07-21 DIAGNOSIS — M9904 Segmental and somatic dysfunction of sacral region: Secondary | ICD-10-CM | POA: Diagnosis not present

## 2020-07-22 DIAGNOSIS — Z0189 Encounter for other specified special examinations: Secondary | ICD-10-CM | POA: Diagnosis not present

## 2020-07-23 DIAGNOSIS — M9905 Segmental and somatic dysfunction of pelvic region: Secondary | ICD-10-CM | POA: Diagnosis not present

## 2020-07-23 DIAGNOSIS — M9904 Segmental and somatic dysfunction of sacral region: Secondary | ICD-10-CM | POA: Diagnosis not present

## 2020-07-23 DIAGNOSIS — M9901 Segmental and somatic dysfunction of cervical region: Secondary | ICD-10-CM | POA: Diagnosis not present

## 2020-07-23 DIAGNOSIS — M542 Cervicalgia: Secondary | ICD-10-CM | POA: Diagnosis not present

## 2020-07-24 DIAGNOSIS — M9901 Segmental and somatic dysfunction of cervical region: Secondary | ICD-10-CM | POA: Diagnosis not present

## 2020-07-24 DIAGNOSIS — M9905 Segmental and somatic dysfunction of pelvic region: Secondary | ICD-10-CM | POA: Diagnosis not present

## 2020-07-24 DIAGNOSIS — M9904 Segmental and somatic dysfunction of sacral region: Secondary | ICD-10-CM | POA: Diagnosis not present

## 2020-07-24 DIAGNOSIS — M542 Cervicalgia: Secondary | ICD-10-CM | POA: Diagnosis not present

## 2020-07-25 DIAGNOSIS — M150 Primary generalized (osteo)arthritis: Secondary | ICD-10-CM | POA: Diagnosis not present

## 2020-07-25 DIAGNOSIS — M25562 Pain in left knee: Secondary | ICD-10-CM | POA: Diagnosis not present

## 2020-07-25 DIAGNOSIS — R5382 Chronic fatigue, unspecified: Secondary | ICD-10-CM | POA: Diagnosis not present

## 2020-07-25 DIAGNOSIS — M0609 Rheumatoid arthritis without rheumatoid factor, multiple sites: Secondary | ICD-10-CM | POA: Diagnosis not present

## 2020-07-28 DIAGNOSIS — M9905 Segmental and somatic dysfunction of pelvic region: Secondary | ICD-10-CM | POA: Diagnosis not present

## 2020-07-28 DIAGNOSIS — M542 Cervicalgia: Secondary | ICD-10-CM | POA: Diagnosis not present

## 2020-07-28 DIAGNOSIS — M9901 Segmental and somatic dysfunction of cervical region: Secondary | ICD-10-CM | POA: Diagnosis not present

## 2020-07-28 DIAGNOSIS — M9904 Segmental and somatic dysfunction of sacral region: Secondary | ICD-10-CM | POA: Diagnosis not present

## 2020-08-01 DIAGNOSIS — M9901 Segmental and somatic dysfunction of cervical region: Secondary | ICD-10-CM | POA: Diagnosis not present

## 2020-08-01 DIAGNOSIS — M9905 Segmental and somatic dysfunction of pelvic region: Secondary | ICD-10-CM | POA: Diagnosis not present

## 2020-08-01 DIAGNOSIS — M542 Cervicalgia: Secondary | ICD-10-CM | POA: Diagnosis not present

## 2020-08-01 DIAGNOSIS — M9904 Segmental and somatic dysfunction of sacral region: Secondary | ICD-10-CM | POA: Diagnosis not present

## 2020-08-19 DIAGNOSIS — G4733 Obstructive sleep apnea (adult) (pediatric): Secondary | ICD-10-CM | POA: Diagnosis not present

## 2020-10-08 DIAGNOSIS — M9905 Segmental and somatic dysfunction of pelvic region: Secondary | ICD-10-CM | POA: Diagnosis not present

## 2020-10-08 DIAGNOSIS — M542 Cervicalgia: Secondary | ICD-10-CM | POA: Diagnosis not present

## 2020-10-08 DIAGNOSIS — M9904 Segmental and somatic dysfunction of sacral region: Secondary | ICD-10-CM | POA: Diagnosis not present

## 2020-10-08 DIAGNOSIS — M9901 Segmental and somatic dysfunction of cervical region: Secondary | ICD-10-CM | POA: Diagnosis not present

## 2020-10-13 DIAGNOSIS — M9904 Segmental and somatic dysfunction of sacral region: Secondary | ICD-10-CM | POA: Diagnosis not present

## 2020-10-13 DIAGNOSIS — M9905 Segmental and somatic dysfunction of pelvic region: Secondary | ICD-10-CM | POA: Diagnosis not present

## 2020-10-13 DIAGNOSIS — M9901 Segmental and somatic dysfunction of cervical region: Secondary | ICD-10-CM | POA: Diagnosis not present

## 2020-10-13 DIAGNOSIS — M542 Cervicalgia: Secondary | ICD-10-CM | POA: Diagnosis not present

## 2020-11-04 DIAGNOSIS — Z0189 Encounter for other specified special examinations: Secondary | ICD-10-CM | POA: Diagnosis not present

## 2020-11-07 DIAGNOSIS — M25562 Pain in left knee: Secondary | ICD-10-CM | POA: Diagnosis not present

## 2020-11-07 DIAGNOSIS — M150 Primary generalized (osteo)arthritis: Secondary | ICD-10-CM | POA: Diagnosis not present

## 2020-11-07 DIAGNOSIS — Z79899 Other long term (current) drug therapy: Secondary | ICD-10-CM | POA: Diagnosis not present

## 2020-11-07 DIAGNOSIS — R5382 Chronic fatigue, unspecified: Secondary | ICD-10-CM | POA: Diagnosis not present

## 2020-11-07 DIAGNOSIS — M0609 Rheumatoid arthritis without rheumatoid factor, multiple sites: Secondary | ICD-10-CM | POA: Diagnosis not present

## 2020-11-17 DIAGNOSIS — G4733 Obstructive sleep apnea (adult) (pediatric): Secondary | ICD-10-CM | POA: Diagnosis not present

## 2020-11-24 ENCOUNTER — Other Ambulatory Visit: Payer: Self-pay | Admitting: Obstetrics & Gynecology

## 2020-11-25 DIAGNOSIS — M0609 Rheumatoid arthritis without rheumatoid factor, multiple sites: Secondary | ICD-10-CM | POA: Diagnosis not present

## 2020-11-27 DIAGNOSIS — Z79899 Other long term (current) drug therapy: Secondary | ICD-10-CM | POA: Diagnosis not present

## 2020-12-18 DIAGNOSIS — G4733 Obstructive sleep apnea (adult) (pediatric): Secondary | ICD-10-CM | POA: Diagnosis not present

## 2020-12-24 DIAGNOSIS — M0609 Rheumatoid arthritis without rheumatoid factor, multiple sites: Secondary | ICD-10-CM | POA: Diagnosis not present

## 2020-12-29 ENCOUNTER — Other Ambulatory Visit: Payer: Self-pay | Admitting: Obstetrics & Gynecology

## 2020-12-29 DIAGNOSIS — Z1231 Encounter for screening mammogram for malignant neoplasm of breast: Secondary | ICD-10-CM

## 2021-01-09 ENCOUNTER — Ambulatory Visit
Admission: RE | Admit: 2021-01-09 | Discharge: 2021-01-09 | Disposition: A | Discharge status: Home / Self Care | Payer: BC Managed Care – PPO | Source: Ambulatory Visit | Attending: Obstetrics & Gynecology | Admitting: Obstetrics & Gynecology

## 2021-01-09 ENCOUNTER — Other Ambulatory Visit: Payer: Self-pay

## 2021-01-09 DIAGNOSIS — Z1231 Encounter for screening mammogram for malignant neoplasm of breast: Secondary | ICD-10-CM

## 2021-01-13 ENCOUNTER — Encounter: Payer: Self-pay | Admitting: Obstetrics & Gynecology

## 2021-01-26 DIAGNOSIS — M25561 Pain in right knee: Secondary | ICD-10-CM | POA: Diagnosis not present

## 2021-01-26 DIAGNOSIS — M1712 Unilateral primary osteoarthritis, left knee: Secondary | ICD-10-CM | POA: Diagnosis not present

## 2021-01-27 DIAGNOSIS — Z0189 Encounter for other specified special examinations: Secondary | ICD-10-CM | POA: Diagnosis not present

## 2021-02-02 ENCOUNTER — Other Ambulatory Visit (HOSPITAL_COMMUNITY)
Admission: RE | Admit: 2021-02-02 | Discharge: 2021-02-02 | Disposition: A | Discharge status: Home / Self Care | Payer: BC Managed Care – PPO | Source: Ambulatory Visit | Attending: Obstetrics & Gynecology | Admitting: Obstetrics & Gynecology

## 2021-02-02 ENCOUNTER — Ambulatory Visit (INDEPENDENT_AMBULATORY_CARE_PROVIDER_SITE_OTHER): Payer: BC Managed Care – PPO | Admitting: Obstetrics & Gynecology

## 2021-02-02 ENCOUNTER — Other Ambulatory Visit: Payer: Self-pay

## 2021-02-02 ENCOUNTER — Encounter: Payer: Self-pay | Admitting: Obstetrics & Gynecology

## 2021-02-02 VITALS — BP 120/70 | Ht 63.0 in | Wt 190.0 lb

## 2021-02-02 DIAGNOSIS — Z01419 Encounter for gynecological examination (general) (routine) without abnormal findings: Secondary | ICD-10-CM | POA: Diagnosis not present

## 2021-02-02 DIAGNOSIS — Z124 Encounter for screening for malignant neoplasm of cervix: Secondary | ICD-10-CM | POA: Insufficient documentation

## 2021-02-02 NOTE — Patient Instructions (Signed)
PAP every three years Mammogram every year    Call [phone] to schedule at [facility] next year Colonoscopy every 10 years Labs yearly (with PCP)  Thank you for choosing [facility]. As part of our ongoing efforts to improve patient experience, we would appreciate your feedback. Please fill out the short survey that you will receive by mail or MyChart. Your opinion is important to [place]! - Dr. Kenton Kingfisher

## 2021-02-02 NOTE — Progress Notes (Signed)
HPI:      [name] is a 60 y.o. [id number] who LMP was in the past, she presents today for her annual examination.  The patient has no complaints today. The patient is sexually active. Herlast pap: approximate date 2019 and was normal and last mammogram: approximate date 01/2021 and was normal.  The patient does perform self breast exams.  There is notable family history of breast or ovarian cancer in her family. The patient is not taking hormone replacement therapy. Patient denies post-menopausal vaginal bleeding.   The patient has regular exercise: yes. The patient denies current symptoms of depression.  Trying more aggresively to stop smoking.  Having knee replacement surgery soon.  GYN Hx: Last Colonoscopy:1 year ago. Normal.  Last DEXA: never ago.    PMHx: Past Medical History:  Diagnosis Date   Anxiety    Cancer ([place])    skin   Hyperlipidemia    Restless leg syndrome    Rheumatoid arteritis ([facility])    Past Surgical History:  Procedure Laterality Date   ABLATION     CARPECTOMY     right wrist   CHOLECYSTECTOMY     COLONOSCOPY WITH PROPOFOL N/A 02/22/2020   Procedure: COLONOSCOPY WITH PROPOFOL;  Surgeon: Lin Landsman, MD;  Location: [facility] ENDOSCOPY;  Service: Gastroenterology;  Laterality: N/A;   Family History  Problem Relation Age of Onset   Breast cancer Mother 8   Colon cancer Nephew 44   Social History   Tobacco Use   Smoking status: Current Every Day Smoker   Smokeless tobacco: Never Used  Vaping Use   Vaping Use: Never used  Substance Use Topics   Alcohol use: No   Drug use: No    Current Outpatient Medications:    Dietary Management Product (RHEUMATE PO), Take by mouth., Disp: , Rfl:    gabapentin (NEURONTIN) 100 MG capsule, , Disp: , Rfl:    Golimumab (SIMPONI) 50 MG/0.5ML SOAJ, Inject into the skin., Disp: , Rfl:    hydrochlorothiazide (HYDRODIURIL) 25 MG tablet, Take by mouth., Disp: , Rfl:    Ibuprofen-Famotidine (DUEXIS  PO), Take by mouth., Disp: , Rfl:    albuterol (PROVENTIL HFA;VENTOLIN HFA) 108 (90 Base) MCG/ACT inhaler, , Disp: , Rfl:    azithromycin (ZITHROMAX) 250 MG tablet, , Disp: , Rfl:    buPROPion (WELLBUTRIN XL) 150 MG 24 hr tablet, Take by mouth. (Patient not taking: Reported on 02/02/2021), Disp: , Rfl:    Dietary Management Product (RHEUMATE) CAPS, Take 1 capsule by mouth daily., Disp: , Rfl:    DUEXIS 800-26.6 MG TABS, Take 1 tablet by mouth 3 (three) times daily as needed., Disp: , Rfl: 2   imipramine (TOFRANIL) 25 MG tablet, Take 25 mg by mouth., Disp: , Rfl:    metFORMIN (GLUCOPHAGE-XR) 500 MG 24 hr tablet, Take by mouth. (Patient not taking: Reported on 02/02/2021), Disp: , Rfl:    PARoxetine (PAXIL) 10 MG tablet, Take by mouth., Disp: , Rfl:    pramipexole (MIRAPEX) 0.5 MG tablet, Take by mouth. (Patient not taking: Reported on 02/02/2021), Disp: , Rfl:    pramipexole (MIRAPEX) 1 MG tablet, Take 1 mg by mouth. (Patient not taking: Reported on 02/02/2021), Disp: , Rfl:    predniSONE (DELTASONE) 20 MG tablet, TAKE 2 TABLETS BY MOUTH DAILY FOR 3 DAYS, Disp: , Rfl: 0   XELJANZ XR 11 MG TB24, , Disp: , Rfl:   Current Facility-Administered Medications:    betamethasone acetate-betamethasone sodium phosphate (CELESTONE) injection 3 mg,  3 mg, Intramuscular, Once, Edrick Kins, DPM Allergies: Cortisone, Penicillin g, Sulfa antibiotics, Sulfacetamide, and Penicillins  Review of Systems  Constitutional: Positive for malaise/fatigue. Negative for chills and fever.  HENT: Negative for congestion, sinus pain and sore throat.   Eyes: Negative for blurred vision and pain.  Respiratory: Negative for cough and wheezing.   Cardiovascular: Negative for chest pain and leg swelling.  Gastrointestinal: Negative for abdominal pain, constipation, diarrhea, heartburn, nausea and vomiting.  Genitourinary: Negative for dysuria, frequency, hematuria and urgency.  Musculoskeletal: Positive for joint  pain. Negative for back pain, myalgias and neck pain.  Skin: Negative for itching and rash.  Neurological: Negative for dizziness, tremors and weakness.  Endo/Heme/Allergies: Does not bruise/bleed easily.  Psychiatric/Behavioral: Negative for depression. The patient is not nervous/anxious and does not have insomnia.     Objective: BP 120/70    Ht 5\' 3"  (1.6 m)    Wt 190 lb (86.2 kg)    BMI 33.66 kg/m   Filed Weights   02/02/21 0851  Weight: 190 lb (86.2 kg)   Body mass index is 33.66 kg/m. Physical Exam Constitutional:      General: She is not in acute distress.    Appearance: She is well-developed and well-nourished.  Genitourinary:     Vagina, uterus and rectum normal.     There is no rash or lesion on the right labia.     There is no rash or lesion on the left labia.    No lesions in the vagina.     No vaginal bleeding.     No vaginal prolapse present.    Mild vaginal atrophy present.     Right Adnexa: not tender and no mass present.    Left Adnexa: not tender and no mass present.    No cervical motion tenderness, friability, lesion or polyp.     Uterus is mobile.     Uterus is not enlarged.     No uterine mass detected.    Uterus is midaxial.     Pelvic exam was performed with patient in the lithotomy position.  Breasts:     Right: No mass, skin change or tenderness.     Left: No mass, skin change or tenderness.    HENT:     Head: Normocephalic and atraumatic. No laceration.     Right Ear: Hearing normal.     Left Ear: Hearing normal.     Nose: No epistaxis or foreign body.     Mouth/Throat:     Mouth: Oropharynx is clear and moist and mucous membranes are normal.     Pharynx: Uvula midline.  Eyes:     Pupils: Pupils are equal, round, and reactive to light.  Neck:     Thyroid: No thyromegaly.  Cardiovascular:     Rate and Rhythm: Normal rate and regular rhythm.     Heart sounds: No murmur heard. No friction rub. No gallop.   Pulmonary:     Effort:  Pulmonary effort is normal. No respiratory distress.     Breath sounds: Normal breath sounds. No wheezing.  Abdominal:     General: Bowel sounds are normal. There is no distension.     Palpations: Abdomen is soft.     Tenderness: There is no abdominal tenderness. There is no rebound.  Musculoskeletal:        General: Normal range of motion.     Cervical back: Normal range of motion and neck supple.  Neurological:  Mental Status: She is alert and oriented to person, place, and time.     Cranial Nerves: No cranial nerve deficit.  Skin:    General: Skin is warm and dry.  Psychiatric:        Mood and Affect: Mood and affect normal.        Judgment: Judgment normal.  Vitals reviewed.     Assessment: Annual Exam 1. Women's annual routine gynecological examination   2. Screening for cervical cancer     Plan:            1.  Cervical Screening-  Pap smear done today  2. Breast screening- Exam annually and mammogram scheduled  3. Colonoscopy every 10 years, Hemoccult testing after age 12  4. Labs managed by PCP and Rheum  5. Counseling for hormonal therapy: none              6. FRAX - FRAX score for assessing the 10 year probability for fracture calculated and discussed today.  Based on age and score today, DEXA is not currently scheduled.    F/U  Return in about 1 year (around 02/02/2022) for Annual.  Barnett Applebaum, MD, [facility], [facility] 02/02/2021  9:02 AM

## 2021-02-03 DIAGNOSIS — F17210 Nicotine dependence, cigarettes, uncomplicated: Secondary | ICD-10-CM | POA: Diagnosis not present

## 2021-02-03 LAB — CYTOLOGY - PAP
Comment: NEGATIVE
Diagnosis: NEGATIVE
High risk HPV: NEGATIVE

## 2021-02-10 ENCOUNTER — Encounter (HOSPITAL_BASED_OUTPATIENT_CLINIC_OR_DEPARTMENT_OTHER): Payer: Self-pay | Admitting: Family Medicine

## 2021-02-10 ENCOUNTER — Other Ambulatory Visit: Payer: Self-pay

## 2021-02-10 ENCOUNTER — Ambulatory Visit (HOSPITAL_BASED_OUTPATIENT_CLINIC_OR_DEPARTMENT_OTHER): Payer: BC Managed Care – PPO | Admitting: Family Medicine

## 2021-02-10 VITALS — BP 122/68 | HR 73 | Ht 63.0 in | Wt 189.8 lb

## 2021-02-10 DIAGNOSIS — F17200 Nicotine dependence, unspecified, uncomplicated: Secondary | ICD-10-CM | POA: Diagnosis not present

## 2021-02-10 DIAGNOSIS — R7309 Other abnormal glucose: Secondary | ICD-10-CM

## 2021-02-10 DIAGNOSIS — R7303 Prediabetes: Secondary | ICD-10-CM | POA: Insufficient documentation

## 2021-02-10 DIAGNOSIS — G4733 Obstructive sleep apnea (adult) (pediatric): Secondary | ICD-10-CM | POA: Diagnosis not present

## 2021-02-10 DIAGNOSIS — I10 Essential (primary) hypertension: Secondary | ICD-10-CM

## 2021-02-10 DIAGNOSIS — M150 Primary generalized (osteo)arthritis: Secondary | ICD-10-CM | POA: Diagnosis not present

## 2021-02-10 DIAGNOSIS — M25562 Pain in left knee: Secondary | ICD-10-CM | POA: Diagnosis not present

## 2021-02-10 DIAGNOSIS — M0609 Rheumatoid arthritis without rheumatoid factor, multiple sites: Secondary | ICD-10-CM | POA: Diagnosis not present

## 2021-02-10 DIAGNOSIS — R5382 Chronic fatigue, unspecified: Secondary | ICD-10-CM | POA: Diagnosis not present

## 2021-02-10 NOTE — Assessment & Plan Note (Addendum)
Blood pressure at goal in office today Reviewed outside labs, these will be scanned to chart, normal electrolytes and creatinine function Continue with hydrochlorothiazide as prescribed Continue with lifestyle modifications, counseled on these

## 2021-02-10 NOTE — Assessment & Plan Note (Addendum)
Has had elevated readings of hemoglobin K8M, uncertain patient has ever truly crossed the threshold to be diagnosed with diabetes Will request records from prior PCP to review for laboratory testing and management Continue with lifestyle modifications as she is currently not taking any medications in regards to this issue Necessary screening test to be completed once determination made on whether patient has true diagnosis of diabetes versus prediabetes

## 2021-02-10 NOTE — Assessment & Plan Note (Signed)
Patient currently in process of quitting as orthopedic surgeon is requiring this prior to knee replacement Provided encouragement pertaining to smoking cessation and discussed potential benefits associated with this

## 2021-02-10 NOTE — Patient Instructions (Addendum)
Medication Instructions:  Your physician recommends that you continue on your current medications as directed. Please refer to the Current Medication list given to you today. *If you need a refill on any your medications before your next appointment, please call your pharmacy first. If no refills are authorized on file call the office.*  Lab Work: None ordered Today If you have labs (blood work) drawn today and your tests are completely normal, you will receive your results only by: Marland Kitchen MyChart Message (if you have MyChart) OR . A phone call from our staff. Please ensure you check your voicemail in the event that you authorized detailed messages to be left on a delegated number. If you have any lab test that is abnormal or we need to change your treatment, we will call you to review the results.  Your physician has recommended that you have a sleep study. This test records several body functions during sleep, including: brain activity, eye movement, oxygen and carbon dioxide blood levels, heart rate and rhythm, breathing rate and rhythm, the flow of air through your mouth and nose, snoring, body muscle movements, and chest and belly movement.  Follow-Up: Your next appointment:   Your physician recommends that you schedule a follow-up appointment in: 6 MONTHS with Dr. De [place]am  We recommend signing up for the patient portal called "MyChart".  Sign up information is provided on this After Visit Summary.  MyChart is used to connect with patients for Virtual Visits (Telemedicine).  Patients are able to view lab/test results, encounter notes, upcoming appointments, etc.  Non-urgent messages can be sent to your provider as well.   To learn more about what you can do with MyChart, go to [email or web address].

## 2021-02-10 NOTE — Progress Notes (Signed)
New Patient Office Visit  Subjective:  Patient ID: Cheryl Shepherd, female    DOB: May 26, 1961  Age: 60 y.o. MRN: [id number]  CC:  Chief Complaint  Patient presents with  . Hypertension  . Diabetes  . Sleep Apnea    HPI Cheryl Shepherd is a 60 year old female presenting to establish care.  She has a past medical history of hypertension, sleep apnea, diabetes.  She sees [facility] rheumatology for management of rheumatoid arthritis.  Planning to have knee replacement completed with orthopedic surgery.  Hypertension: Managed with hydrochlorothiazide, lifestyle modifications.  Does report about a 40 pound weight loss over the past few years that is intentional.  Denies any issues with chest pain, lightheadedness, dizziness.  Sleep apnea: Diagnosed about 6 to 7 years ago.  Has been using CPAP, reports tolerating this well.  Does indicate that she will have some burping and flatulence in the morning.  Initial sleep study and titration was done prior to her significant weight loss.  Diabetes: Reports being told that she had prediabetes by her prior PCP.  Unsure of diabetes diagnosis.  Most recent lab results revealed A1c of 5.6%.  Was taking Metformin one-point with prior PCP, but is no longer taking this.  Did have intentional weight loss of 40 pounds and has been engaging in other lifestyle modifications.  Tobacco use: Smokes about 1 pack/day.  Is trying to quit in order to have knee replacement completed as per orthopedic surgeons requirements.  Currently planning to transition to vaping instead of smoking cigarettes.  Past Medical History:  Diagnosis Date  . Anxiety   . Cancer ([place])    skin  . Hyperlipidemia   . Restless leg syndrome   . Rheumatoid arteritis ([place])     Past Surgical History:  Procedure Laterality Date  . ABLATION    . CARPECTOMY     right wrist  . CHOLECYSTECTOMY    . COLONOSCOPY WITH PROPOFOL N/A 02/22/2020   Procedure: COLONOSCOPY WITH PROPOFOL;  Surgeon:  Lin Landsman, MD;  Location[facility] ENDOSCOPY;  Service: Gastroenterology;  Laterality: N/A;    Family History  Problem Relation Age of Onset  . Breast cancer Mother 67  . Colon cancer Nephew 76    Social History   Socioeconomic History  . Marital status: Married    Spouse name: Not on file  . Number of children: Not on file  . Years of education: Not on file  . Highest education level: Not on file  Occupational History  . Not on file  Tobacco Use  . Smoking status: Current Every Day Smoker  . Smokeless tobacco: Never Used  Vaping Use  . Vaping Use: Never used  Substance and Sexual Activity  . Alcohol use: No  . Drug use: No  . Sexual activity: Yes  Other Topics Concern  . Not on file  Social History Narrative  . Not on file   Social Determinants of Health   Financial Resource Strain: Not on file  Food Insecurity: Not on file  Transportation Needs: Not on file  Physical Activity: Not on file  Stress: Not on file  Social Connections: Not on file  Intimate Partner Violence: Not on file    Objective:   Today's Vitals: BP 122/68   Pulse 73   Ht 5\' 3"  (1.6 m)   Wt 189 lb 12.8 oz (86.1 kg)   SpO2 98%   BMI 33.62 kg/m   Physical Exam  Pleasant 60 year old female in no acute distress  Cardiovascular exam with regular rate and rhythm, no murmurs Lungs clear to auscultation bilaterally, no wheezing appreciated   Assessment & Plan:   Problem List Items Addressed This Visit      Cardiovascular and Mediastinum   Hypertension    Blood pressure at goal in office today Reviewed outside labs, these will be scanned to chart, normal electrolytes and creatinine function Continue with hydrochlorothiazide as prescribed Continue with lifestyle modifications, counseled on these        Respiratory   Sleep apnea - Primary    Continue with use of CPAP We will place order for sleep study to determine continued need of CPAP as well as titration if CPAP is still  needed        Other   Elevated hemoglobin A1c    Has had elevated readings of hemoglobin C1E, uncertain patient has ever truly crossed the threshold to be diagnosed with diabetes Will request records from prior PCP to review for laboratory testing and management Continue with lifestyle modifications as she is currently not taking any medications in regards to this issue Necessary screening test to be completed once determination made on whether patient has true diagnosis of diabetes versus prediabetes      Tobacco use disorder    Patient currently in process of quitting as orthopedic surgeon is requiring this prior to knee replacement Provided encouragement pertaining to smoking cessation and discussed potential benefits associated with this        Depression screening completed today  Outpatient Encounter Medications as of 02/10/2021  Medication Sig  . Dietary Management Product (RHEUMATE PO) Take by mouth.  . Dietary Management Product (RHEUMATE) CAPS Take 1 capsule by mouth daily.  . DUEXIS 800-26.6 MG TABS Take 1 tablet by mouth 3 (three) times daily as needed.  . gabapentin (NEURONTIN) 100 MG capsule   . Golimumab (SIMPONI) 50 MG/0.5ML SOAJ Inject into the skin.  . hydrochlorothiazide (HYDRODIURIL) 25 MG tablet Take by mouth.  . Hydroxychloroquine Sulfate 100 MG TABS Take 2 tablets by mouth daily.  . predniSONE (DELTASONE) 20 MG tablet TAKE 2 TABLETS BY MOUTH DAILY FOR 3 DAYS  . PARoxetine (PAXIL) 10 MG tablet Take by mouth.  . [DISCONTINUED] albuterol (PROVENTIL HFA;VENTOLIN HFA) 108 (90 Base) MCG/ACT inhaler  (Patient not taking: Reported on 02/10/2021)  . [DISCONTINUED] azithromycin (ZITHROMAX) 250 MG tablet  (Patient not taking: Reported on 02/10/2021)  . [DISCONTINUED] buPROPion (WELLBUTRIN XL) 150 MG 24 hr tablet Take by mouth. (Patient not taking: Reported on 02/10/2021)  . [DISCONTINUED] Ibuprofen-Famotidine (DUEXIS PO) Take by mouth. (Patient not taking: Reported on 02/10/2021)   . [DISCONTINUED] imipramine (TOFRANIL) 25 MG tablet Take 25 mg by mouth. (Patient not taking: Reported on 02/10/2021)  . [DISCONTINUED] metFORMIN (GLUCOPHAGE-XR) 500 MG 24 hr tablet Take by mouth. (Patient not taking: Reported on 02/10/2021)  . [DISCONTINUED] pramipexole (MIRAPEX) 0.5 MG tablet Take by mouth. (Patient not taking: Reported on 02/10/2021)  . [DISCONTINUED] pramipexole (MIRAPEX) 1 MG tablet Take 1 mg by mouth. (Patient not taking: Reported on 02/10/2021)  . [DISCONTINUED] XELJANZ XR 11 MG TB24  (Patient not taking: Reported on 02/10/2021)   Facility-Administered Encounter Medications as of 02/10/2021  Medication  . betamethasone acetate-betamethasone sodium phosphate (CELESTONE) injection 3 mg   Spent 60 minutes on this patient encounter, including preparation, chart review, face-to-face counseling with patient and coordination of care, and documentation of encounter  Follow-up: Return in about 6 months (around 08/13/2021) for follow up - in office.   Cloria Spring  Gu[place], MD

## 2021-02-10 NOTE — Assessment & Plan Note (Signed)
Continue with use of CPAP We will place order for sleep study to determine continued need of CPAP as well as titration if CPAP is still needed

## 2021-02-11 ENCOUNTER — Telehealth (HOSPITAL_BASED_OUTPATIENT_CLINIC_OR_DEPARTMENT_OTHER): Payer: Self-pay | Admitting: Family Medicine

## 2021-02-11 ENCOUNTER — Telehealth (HOSPITAL_BASED_OUTPATIENT_CLINIC_OR_DEPARTMENT_OTHER): Payer: Self-pay

## 2021-02-11 DIAGNOSIS — M9904 Segmental and somatic dysfunction of sacral region: Secondary | ICD-10-CM | POA: Diagnosis not present

## 2021-02-11 DIAGNOSIS — M9905 Segmental and somatic dysfunction of pelvic region: Secondary | ICD-10-CM | POA: Diagnosis not present

## 2021-02-11 DIAGNOSIS — M62830 Muscle spasm of back: Secondary | ICD-10-CM | POA: Diagnosis not present

## 2021-02-11 DIAGNOSIS — M9901 Segmental and somatic dysfunction of cervical region: Secondary | ICD-10-CM | POA: Diagnosis not present

## 2021-02-11 NOTE — Telephone Encounter (Signed)
Received a fax from [facility] for surgery clearance for patient. Gave paperwork to Dr. de Guam.

## 2021-02-11 NOTE — Telephone Encounter (Signed)
Left message for [facility] to contact me regarding patients CPAP therapy and initiation of a titration as patient has lost a significant amount of weight and now is having burping and flatulence issues. Pt inquired as to whether or not her pressures may be to high. I do not have any information about current pressure settings or initial sleep study. Will need to coordinate with current DME.

## 2021-02-18 ENCOUNTER — Other Ambulatory Visit: Payer: Self-pay

## 2021-02-18 ENCOUNTER — Encounter (HOSPITAL_BASED_OUTPATIENT_CLINIC_OR_DEPARTMENT_OTHER): Payer: Self-pay | Admitting: Family Medicine

## 2021-02-18 ENCOUNTER — Ambulatory Visit (HOSPITAL_BASED_OUTPATIENT_CLINIC_OR_DEPARTMENT_OTHER): Payer: BC Managed Care – PPO | Admitting: Family Medicine

## 2021-02-18 ENCOUNTER — Telehealth (HOSPITAL_BASED_OUTPATIENT_CLINIC_OR_DEPARTMENT_OTHER): Payer: Self-pay

## 2021-02-18 VITALS — BP 122/68 | HR 73 | Ht 63.0 in | Wt 191.0 lb

## 2021-02-18 DIAGNOSIS — Z01818 Encounter for other preprocedural examination: Secondary | ICD-10-CM | POA: Diagnosis not present

## 2021-02-18 DIAGNOSIS — G4733 Obstructive sleep apnea (adult) (pediatric): Secondary | ICD-10-CM | POA: Diagnosis not present

## 2021-02-18 NOTE — Patient Instructions (Addendum)
   Medication Instructions:  Your physician recommends that you continue on your current medications as directed. Please refer to the Current Medication list given to you today. --If you need a refill on any your medications before your next appointment, please call your pharmacy first. If no refills are authorized on file call the office.--  Lab Work: None Ordered If you have labs (blood work) drawn today and your tests are completely normal, you will receive your results only by: Marland Kitchen MyChart Message (if you have MyChart) OR . A phone call from our staff. Please ensure you check your voicemail in the event that you authorized detailed messages to be left on a delegated number. If you have any lab test that is abnormal or we need to change your treatment, we will call you to review the results.  Procedures/Imaging: Your physician has recommended that you have a sleep study. This test records several body functions during sleep, including: brain activity, eye movement, oxygen and carbon dioxide blood levels, heart rate and rhythm, breathing rate and rhythm, the flow of air through your mouth and nose, snoring, body muscle movements, and chest and belly movement.   Follow-Up: Your next appointment:   Your physician recommends that you keep your scheduled follow-up appointment in September with Dr. De [place]am.   We recommend signing up for the patient portal called "MyChart".  Sign up information is provided on this After Visit Summary.  MyChart is used to connect with patients for Virtual Visits (Telemedicine).  Patients are able to view lab/test results, encounter notes, upcoming appointments, etc.  Non-urgent messages can be sent to your provider as well.   To learn more about what you can do with MyChart, go to [email or web address].    Thanks for letting us be apart of your health journey!!  Primary Care and Sports Medicine    Dr. de Guam and Worthy Keeler, DNP, AGNP

## 2021-02-18 NOTE — Assessment & Plan Note (Signed)
Feel the patient is at average medical risk for intended procedure with reasonable expectation of achieving desired outcomes from surgery Patient is aware of the need to quit smoking prior to the procedure and in the subsequent 4 weeks following the surgery.  Discussed various cessation methods in the office today

## 2021-02-18 NOTE — Telephone Encounter (Signed)
Faxed Surgical Clearance form and office note to [employer]. Will retain a copy for records

## 2021-02-18 NOTE — Progress Notes (Signed)
    Procedures performed today:    None.  Independent interpretation of notes and tests performed by another provider:   Reviewed labs patient had completed previously through work.  No significant abnormalities noted on these labs, in particular no anemia, leukocytosis, normal electrolytes and kidney function.  These results have been scanned into the chart  Brief History, Exam, Impression, and Recommendations:    Cheryl Shepherd is a 60 y.o. presenting for preoperative evaluation/clearance. The planned procedure is left knee arthroplasty with the treatment goal of reducing pain and improving function and mobility. Cardiac risk for planned procedure is Intermediate (1 to 5%) - intraperitoneal or intrathoracic surgery, carotid endarterectomy, head and neck surgery, orthopedic surgery, prostate surgery  Signs or symptoms of cardiovascular disease? No New or unstable cardiopulmonary signs or symptoms? No Urinary symptoms or undergoing surgical implantation of foreign material (prosthetic joint, heart valve) or invasive urologic procedure? Yes -joint replacement surgery  BP 122/68   Pulse 73   Ht 5\' 3"  (1.6 m)   Wt 191 lb (86.6 kg)   SpO2 98%   BMI 33.83 kg/m   Exam: Pleasant 60 year old female in no acute distress Cardiovascular exam with regular rate and rhythm, no murmurs appreciated Lungs clear to auscultation bilaterally  Preoperative clearance Feel the patient is at average medical risk for intended procedure with reasonable expectation of achieving desired outcomes from surgery Patient is aware of the need to quit smoking prior to the procedure and in the subsequent 4 weeks following the surgery.  Discussed various cessation methods in the office today  Based on history and exam will order the following preoperative tests: None - patient with recent lab work through her work without significant abnormality.  These were reviewed and scanned into chart.  Patient will also be  having labs completed with orthopedist at time of surgery.  Patient is medically cleared to proceed with planned surgery.    ___________________________________________ Cheryl Zundel de [place], MD, ABFM, CAQSM Primary Care and [place]  Spent 30 minutes on this patient encounter, including preparation, chart review, face-to-face counseling with patient and coordination of care, and documentation of encounter

## 2021-02-19 DIAGNOSIS — F17210 Nicotine dependence, cigarettes, uncomplicated: Secondary | ICD-10-CM | POA: Diagnosis not present

## 2021-02-26 ENCOUNTER — Telehealth (HOSPITAL_BASED_OUTPATIENT_CLINIC_OR_DEPARTMENT_OTHER): Payer: Self-pay

## 2021-02-26 NOTE — Telephone Encounter (Signed)
Medical records received from [facility] and [facility] Given to Dr. De [place]am to review

## 2021-03-03 DIAGNOSIS — M0609 Rheumatoid arthritis without rheumatoid factor, multiple sites: Secondary | ICD-10-CM | POA: Diagnosis not present

## 2021-03-04 NOTE — Telephone Encounter (Signed)
Reviewed records. No labs regarding diagnosis of DM which was hoping for clarity on this. Also contained prior GI notes.

## 2021-03-19 DIAGNOSIS — Z0189 Encounter for other specified special examinations: Secondary | ICD-10-CM | POA: Diagnosis not present

## 2021-03-23 DIAGNOSIS — M1712 Unilateral primary osteoarthritis, left knee: Secondary | ICD-10-CM | POA: Diagnosis not present

## 2021-04-01 DIAGNOSIS — R059 Cough, unspecified: Secondary | ICD-10-CM | POA: Diagnosis not present

## 2021-04-01 DIAGNOSIS — U071 COVID-19: Secondary | ICD-10-CM | POA: Diagnosis not present

## 2021-04-02 ENCOUNTER — Telehealth (HOSPITAL_BASED_OUTPATIENT_CLINIC_OR_DEPARTMENT_OTHER): Payer: Self-pay

## 2021-04-02 DIAGNOSIS — U071 COVID-19: Secondary | ICD-10-CM | POA: Diagnosis not present

## 2021-04-02 NOTE — Telephone Encounter (Signed)
patint called in to inform the Dr Tennis Must Guam that she tested positive for Covid yesterday 04/02/21 Patient states she was tested by a NP at her place of employment and it was recommended by that physician that she contact her PCP to inquire about infusion therapy vs oral therapy Confirmed with Dr Tennis Must [place]am that it is ok to place referral for evaluation by Covid infusion physicians Pt is aware and agreeable to referral being placed.

## 2021-04-04 ENCOUNTER — Telehealth: Payer: Self-pay | Admitting: Physician Assistant

## 2021-04-04 NOTE — Telephone Encounter (Signed)
Called to discuss with Cheryl Shepherd about Covid symptoms and the use of bebtelivomab, remdisivir or oral therapies for those with mild to moderate Covid symptoms and at a high risk of hospitalization.    Pt already started on paxlovid by PCP. Do not offer mab again until Monday and she will be too far out of window (7 days)   Patient Active Problem List   Diagnosis Date Noted  . Preoperative clearance 02/18/2021  . Elevated hemoglobin A1c 02/10/2021  . Tobacco use disorder 02/10/2021  . Family history of colon cancer requiring screening colonoscopy   . Encounter for screening colonoscopy   . Diabetes mellitus ([place]) 02/22/2017  . Hypertension 02/22/2017  . Sleep apnea 02/22/2017  . Abdominal pain, acute, right upper quadrant 10/07/2014  . Acute diarrhea 10/07/2014  . Incomplete emptying of bladder 09/17/2013  . Retention of urine 09/17/2013  . Female stress incontinence 01/29/2013  . Frequency of micturition 01/29/2013  . Nocturia 01/29/2013  . Urge incontinence 01/29/2013  . Increased frequency of urination 01/29/2013    Angelena Form PA-C

## 2021-05-12 DIAGNOSIS — M0609 Rheumatoid arthritis without rheumatoid factor, multiple sites: Secondary | ICD-10-CM | POA: Diagnosis not present

## 2021-05-25 DIAGNOSIS — Z0189 Encounter for other specified special examinations: Secondary | ICD-10-CM | POA: Diagnosis not present

## 2021-05-27 DIAGNOSIS — Z0189 Encounter for other specified special examinations: Secondary | ICD-10-CM | POA: Diagnosis not present

## 2021-06-04 DIAGNOSIS — M1712 Unilateral primary osteoarthritis, left knee: Secondary | ICD-10-CM | POA: Diagnosis not present

## 2021-06-05 HISTORY — PX: KNEE SURGERY: SHX244

## 2021-06-09 DIAGNOSIS — H02409 Unspecified ptosis of unspecified eyelid: Secondary | ICD-10-CM | POA: Diagnosis not present

## 2021-06-18 DIAGNOSIS — M1712 Unilateral primary osteoarthritis, left knee: Secondary | ICD-10-CM | POA: Diagnosis not present

## 2021-07-21 DIAGNOSIS — Z79899 Other long term (current) drug therapy: Secondary | ICD-10-CM | POA: Diagnosis not present

## 2021-07-21 DIAGNOSIS — R5383 Other fatigue: Secondary | ICD-10-CM | POA: Diagnosis not present

## 2021-07-21 DIAGNOSIS — Z111 Encounter for screening for respiratory tuberculosis: Secondary | ICD-10-CM | POA: Diagnosis not present

## 2021-07-21 DIAGNOSIS — M0609 Rheumatoid arthritis without rheumatoid factor, multiple sites: Secondary | ICD-10-CM | POA: Diagnosis not present

## 2021-08-03 ENCOUNTER — Encounter (HOSPITAL_BASED_OUTPATIENT_CLINIC_OR_DEPARTMENT_OTHER): Payer: Self-pay | Admitting: Family Medicine

## 2021-08-03 ENCOUNTER — Other Ambulatory Visit: Payer: Self-pay

## 2021-08-03 ENCOUNTER — Ambulatory Visit (INDEPENDENT_AMBULATORY_CARE_PROVIDER_SITE_OTHER): Payer: BC Managed Care – PPO | Admitting: Family Medicine

## 2021-08-03 VITALS — BP 128/74 | HR 79 | Ht 63.0 in | Wt 186.0 lb

## 2021-08-03 DIAGNOSIS — G2581 Restless legs syndrome: Secondary | ICD-10-CM | POA: Diagnosis not present

## 2021-08-03 DIAGNOSIS — E785 Hyperlipidemia, unspecified: Secondary | ICD-10-CM | POA: Diagnosis not present

## 2021-08-03 DIAGNOSIS — R7303 Prediabetes: Secondary | ICD-10-CM

## 2021-08-03 DIAGNOSIS — F17200 Nicotine dependence, unspecified, uncomplicated: Secondary | ICD-10-CM

## 2021-08-03 DIAGNOSIS — R7309 Other abnormal glucose: Secondary | ICD-10-CM | POA: Diagnosis not present

## 2021-08-03 NOTE — Assessment & Plan Note (Signed)
Most recent reading was 5.6% Review of prior outside documentation did not reveal an elevated hemoglobin A1c within diabetic range. Will check hemoglobin A1c now, order for labs provided

## 2021-08-03 NOTE — Progress Notes (Signed)
    Procedures performed today:    None.  Independent interpretation of notes and tests performed by another provider:   None.  Brief History, Exam, Impression, and Recommendations:    BP 128/74   Pulse 79   Ht '5\' 3"'$  (1.6 m)   Wt 186 lb (84.4 kg)   SpO2 96%   BMI 32.95 kg/m   Prediabetes Most recent reading was 5.6% Review of prior outside documentation did not reveal an elevated hemoglobin A1c within diabetic range. Will check hemoglobin A1c now, order for labs provided  Tobacco use disorder Continue with current tobacco use Again discussed importance of smoking cessation with patient today Patient is aware and is interested in quitting, she tentatively has plans to quit prior to return to work in about 6 weeks Made patient aware of our interest in helping her quit due to the numerous negative impact related to tobacco use, patient appreciated this Also discussed how tobacco use can negatively impact sleep quality  Restless leg syndrome Presently manages with gabapentin 300 mg at night Feels that symptoms have been exacerbated with recent knee surgery Reports increased restlessness of legs, inability to lay and relax at night due to need to be moving her legs due to discomfort Has resolved, her sleep quality has greatly diminished since her surgery Discussed options with patient We will check iron studies as this can be associated with.  Current symptoms Will increase dose of gabapentin to 400 mg to be taken 2 hours prior to bedtime Discussed possible titration of gabapentin moving forward at 1 to 2-week intervals pending response to this treatment Also discussed option of referral for CBT related to impaired sleep quality, patient declines at this time Discussed importance of limiting caffeine, particularly in the afternoon and evening, avoidance of alcohol Encourage to incorporate regular daily physical activity as tolerated postoperatively  Hyperlipidemia Noted on  prior labs, will check lipid panel Plan to calculate ASCVD risk score once labs obtained and discuss role of possible statin initiation  Spent 30 minutes on this patient encounter, including preparation, chart review, face-to-face counseling with patient and coordination of care, and documentation of encounter  Plan for follow-up in 2 to 3 weeks to monitor progress with gabapentin titration and response to therapy   ___________________________________________ Jacoba Cherney de [place], MD, ABFM, CAQSM Primary Care and [place]

## 2021-08-03 NOTE — Assessment & Plan Note (Signed)
Continue with current tobacco use Again discussed importance of smoking cessation with patient today Patient is aware and is interested in quitting, she tentatively has plans to quit prior to return to work in about 6 weeks Made patient aware of our interest in helping her quit due to the numerous negative impact related to tobacco use, patient appreciated this Also discussed how tobacco use can negatively impact sleep quality

## 2021-08-03 NOTE — Assessment & Plan Note (Signed)
Noted on prior labs, will check lipid panel Plan to calculate ASCVD risk score once labs obtained and discuss role of possible statin initiation

## 2021-08-03 NOTE — Patient Instructions (Signed)
  Medication Instructions:  Your physician has recommended you make the following change in your medication:  - INCREASE Gabapentin to 400 mg - take 400 mg at bedtime --If you need a refill on any your medications before your next appointment, please call your pharmacy first. If no refills are authorized on file call the office.--  Follow-Up: Your next appointment:   Your physician recommends that you schedule a follow-up appointment in: 2-3 WEEK with Dr. de [place]am  Thanks for letting us be apart of your health journey!!  Primary Care and Sports Medicine   Dr. Arlina Robes [place]   We encourage you to activate your patient portal called "MyChart".  Sign up information is provided on this After Visit Summary.  MyChart is used to connect with patients for Virtual Visits (Telemedicine).  Patients are able to view lab/test results, encounter notes, upcoming appointments, etc.  Non-urgent messages can be sent to your provider as well. To learn more about what you can do with MyChart, please visit --  [email or web address].

## 2021-08-03 NOTE — Assessment & Plan Note (Signed)
Presently manages with gabapentin 300 mg at night Feels that symptoms have been exacerbated with recent knee surgery Reports increased restlessness of legs, inability to lay and relax at night due to need to be moving her legs due to discomfort Has resolved, her sleep quality has greatly diminished since her surgery Discussed options with patient We will check iron studies as this can be associated with.  Current symptoms Will increase dose of gabapentin to 400 mg to be taken 2 hours prior to bedtime Discussed possible titration of gabapentin moving forward at 1 to 2-week intervals pending response to this treatment Also discussed option of referral for CBT related to impaired sleep quality, patient declines at this time Discussed importance of limiting caffeine, particularly in the afternoon and evening, avoidance of alcohol Encourage to incorporate regular daily physical activity as tolerated postoperatively

## 2021-08-04 ENCOUNTER — Encounter (HOSPITAL_BASED_OUTPATIENT_CLINIC_OR_DEPARTMENT_OTHER): Payer: Self-pay | Admitting: Family Medicine

## 2021-08-04 DIAGNOSIS — G4700 Insomnia, unspecified: Secondary | ICD-10-CM

## 2021-08-05 ENCOUNTER — Encounter (HOSPITAL_BASED_OUTPATIENT_CLINIC_OR_DEPARTMENT_OTHER): Payer: Self-pay | Admitting: Family Medicine

## 2021-08-05 MED ORDER — GABAPENTIN 100 MG PO CAPS: 500.0000 mg | ORAL_CAPSULE | Freq: Every day | ORAL | 1 refills | Status: DC

## 2021-08-13 ENCOUNTER — Ambulatory Visit (HOSPITAL_BASED_OUTPATIENT_CLINIC_OR_DEPARTMENT_OTHER): Payer: BC Managed Care – PPO | Admitting: Family Medicine

## 2021-08-24 ENCOUNTER — Other Ambulatory Visit: Payer: Self-pay

## 2021-08-24 ENCOUNTER — Ambulatory Visit (INDEPENDENT_AMBULATORY_CARE_PROVIDER_SITE_OTHER): Payer: BC Managed Care – PPO | Admitting: Family Medicine

## 2021-08-24 ENCOUNTER — Encounter (HOSPITAL_BASED_OUTPATIENT_CLINIC_OR_DEPARTMENT_OTHER): Payer: Self-pay | Admitting: Family Medicine

## 2021-08-24 DIAGNOSIS — E785 Hyperlipidemia, unspecified: Secondary | ICD-10-CM

## 2021-08-24 DIAGNOSIS — G2581 Restless legs syndrome: Secondary | ICD-10-CM | POA: Diagnosis not present

## 2021-08-24 DIAGNOSIS — R059 Cough, unspecified: Secondary | ICD-10-CM | POA: Insufficient documentation

## 2021-08-24 NOTE — Assessment & Plan Note (Addendum)
She reports that about 1 week ago she began to develop some congestion and a sore throat.  She feels the symptoms felt similar to when she had coronavirus infection in the past.  She unfortunately has not had a recent test for coronavirus since symptoms began.  She also felt feverish at onset of symptoms, did not check her temperature at that time, has not had any recent symptoms of fever, chills or sweats.  She currently has intermittent cough that is productive and feels some chest congestion.  She denies any current shortness of breath. Patient is currently back home, was out at [employer]. On the phone, patient is able to talk in complete sentences, no obvious labored breathing or tachypnea appreciated.  Patient sounds to be in no acute distress. Patient with current tobacco use Patient likely with acute viral illness, bacterial is less likely, however given current symptoms, known tobacco use, productive cough, will proceed with chest x-ray Recommend continuing with conservative measures at this point, ensuring adequate hydration, can use expectorant such as Mucinex Will adjust treatment course as needed once chest x-ray obtained

## 2021-08-24 NOTE — Progress Notes (Signed)
    Virtual Visit via Telephone Note  I connected with Cheryl Shepherd on 08/24/21 at  1:10 PM EDT by telephone and verified that I am speaking with the correct person using two identifiers.  Location: Patient: Home Provider: Clinic   I discussed the limitations, risks, security and privacy concerns of performing an evaluation and management service by telephone and the availability of in person appointments. I also discussed with the patient that there may be a patient responsible charge related to this service. The patient expressed understanding and agreed to proceed.  Brief History, Exam, Impression, and Recommendations:    There were no vitals taken for this visit.  Cough She reports that about 1 week ago she began to develop some congestion and a sore throat.  She feels the symptoms felt similar to when she had coronavirus infection in the past.  She unfortunately has not had a recent test for coronavirus since symptoms began.  She also felt feverish at onset of symptoms, did not check her temperature at that time, has not had any recent symptoms of fever, chills or sweats.  She currently has intermittent cough that is productive and feels some chest congestion.  She denies any current shortness of breath. Patient is currently back home, was out at [employer]. On the phone, patient is able to talk in complete sentences, no obvious labored breathing or tachypnea appreciated.  Patient sounds to be in no acute distress. Patient with current tobacco use Patient likely with acute viral illness, bacterial is less likely, however given current symptoms, known tobacco use, productive cough, will proceed with chest x-ray Recommend continuing with conservative measures at this point, ensuring adequate hydration, can use expectorant such as Mucinex Will adjust treatment course as needed once chest x-ray obtained  Restless leg syndrome Has had some improvement with dose titration of  gabapentin Still with some sleep difficulty, patient has arranged for establishing visit with Dr. Michail Sermon later this week Patient also aware of negative impact of tobacco use on sleep quality Labs completed to verify iron status, no deficiency noted Continue with current dose of gabapentin Continue with scheduled evaluation with Dr. Michail Sermon  Hyperlipidemia Labs received from outside lab collection.  Noted to have slightly elevated LDL ASCVD risk calculated to be 7.7% - this is borderline in terms of statin therapy, with consideration of initiating moderate intensity statin. Another consideration may be to proceed with CAC scoring to further risk stratify. Discuss further at next follow-up  I discussed the assessment and treatment plan with the patient. The patient was provided an opportunity to ask questions and all were answered. The patient agreed with the plan and demonstrated an understanding of the instructions.   The patient was advised to call back or seek an in-person evaluation if the symptoms worsen or if the condition fails to improve as anticipated.  I provided 15 minutes of non-face-to-face time during this encounter.   ___________________________________________ Cheryl Renfrew [place], MD, ABFM, CAQ[facility]

## 2021-08-24 NOTE — Assessment & Plan Note (Addendum)
Has had some improvement with dose titration of gabapentin Still with some sleep difficulty, patient has arranged for establishing visit with Dr. Michail Sermon later this week Patient also aware of negative impact of tobacco use on sleep quality Labs completed to verify iron status, no deficiency noted Continue with current dose of gabapentin Continue with scheduled evaluation with Dr. Michail Sermon

## 2021-08-24 NOTE — Assessment & Plan Note (Addendum)
Labs received from outside lab collection.  Noted to have slightly elevated LDL ASCVD risk calculated to be 7.7% - this is borderline in terms of statin therapy, with consideration of initiating moderate intensity statin. Another consideration may be to proceed with CAC scoring to further risk stratify. Discuss further at next follow-up

## 2021-08-25 DIAGNOSIS — J209 Acute bronchitis, unspecified: Secondary | ICD-10-CM | POA: Diagnosis not present

## 2021-08-26 DIAGNOSIS — M0609 Rheumatoid arthritis without rheumatoid factor, multiple sites: Secondary | ICD-10-CM | POA: Diagnosis not present

## 2021-08-26 DIAGNOSIS — Z79899 Other long term (current) drug therapy: Secondary | ICD-10-CM | POA: Diagnosis not present

## 2021-08-26 DIAGNOSIS — M25561 Pain in right knee: Secondary | ICD-10-CM | POA: Diagnosis not present

## 2021-08-26 DIAGNOSIS — M150 Primary generalized (osteo)arthritis: Secondary | ICD-10-CM | POA: Diagnosis not present

## 2021-08-28 ENCOUNTER — Other Ambulatory Visit: Payer: Self-pay

## 2021-08-28 ENCOUNTER — Ambulatory Visit (INDEPENDENT_AMBULATORY_CARE_PROVIDER_SITE_OTHER): Payer: BC Managed Care – PPO | Admitting: Psychologist

## 2021-08-28 DIAGNOSIS — F411 Generalized anxiety disorder: Secondary | ICD-10-CM | POA: Diagnosis not present

## 2021-08-28 DIAGNOSIS — M25561 Pain in right knee: Secondary | ICD-10-CM | POA: Diagnosis not present

## 2021-09-02 ENCOUNTER — Ambulatory Visit: Payer: BC Managed Care – PPO | Admitting: Psychologist

## 2021-09-04 ENCOUNTER — Ambulatory Visit (INDEPENDENT_AMBULATORY_CARE_PROVIDER_SITE_OTHER): Payer: BC Managed Care – PPO | Admitting: Psychologist

## 2021-09-04 ENCOUNTER — Ambulatory Visit: Payer: BC Managed Care – PPO | Admitting: Psychologist

## 2021-09-04 DIAGNOSIS — F411 Generalized anxiety disorder: Secondary | ICD-10-CM | POA: Diagnosis not present

## 2021-09-10 DIAGNOSIS — G4733 Obstructive sleep apnea (adult) (pediatric): Secondary | ICD-10-CM | POA: Diagnosis not present

## 2021-09-21 DIAGNOSIS — M1712 Unilateral primary osteoarthritis, left knee: Secondary | ICD-10-CM | POA: Diagnosis not present

## 2021-09-22 ENCOUNTER — Ambulatory Visit: Payer: BC Managed Care – PPO | Admitting: Psychologist

## 2021-09-22 DIAGNOSIS — M0609 Rheumatoid arthritis without rheumatoid factor, multiple sites: Secondary | ICD-10-CM | POA: Diagnosis not present

## 2021-09-25 ENCOUNTER — Other Ambulatory Visit (HOSPITAL_BASED_OUTPATIENT_CLINIC_OR_DEPARTMENT_OTHER): Payer: Self-pay | Admitting: Family Medicine

## 2021-10-06 LAB — LAB REPORT - SCANNED
A1c: 5.7
EGFR: 76

## 2021-10-07 DIAGNOSIS — Z713 Dietary counseling and surveillance: Secondary | ICD-10-CM | POA: Diagnosis not present

## 2021-10-07 DIAGNOSIS — Z043 Encounter for examination and observation following other accident: Secondary | ICD-10-CM | POA: Diagnosis not present

## 2021-10-07 DIAGNOSIS — R7303 Prediabetes: Secondary | ICD-10-CM | POA: Diagnosis not present

## 2021-10-11 DIAGNOSIS — G4733 Obstructive sleep apnea (adult) (pediatric): Secondary | ICD-10-CM | POA: Diagnosis not present

## 2021-11-02 DIAGNOSIS — R059 Cough, unspecified: Secondary | ICD-10-CM | POA: Diagnosis not present

## 2021-11-02 DIAGNOSIS — J209 Acute bronchitis, unspecified: Secondary | ICD-10-CM | POA: Diagnosis not present

## 2021-11-10 DIAGNOSIS — G4733 Obstructive sleep apnea (adult) (pediatric): Secondary | ICD-10-CM | POA: Diagnosis not present

## 2021-11-17 DIAGNOSIS — M0609 Rheumatoid arthritis without rheumatoid factor, multiple sites: Secondary | ICD-10-CM | POA: Diagnosis not present

## 2021-12-02 ENCOUNTER — Telehealth: Payer: Self-pay

## 2021-12-02 DIAGNOSIS — Z79899 Other long term (current) drug therapy: Secondary | ICD-10-CM | POA: Diagnosis not present

## 2021-12-02 NOTE — Telephone Encounter (Signed)
Pt calling; needs order for mammogram at [facility].  [phone]

## 2021-12-03 ENCOUNTER — Other Ambulatory Visit: Payer: Self-pay | Admitting: Obstetrics & Gynecology

## 2021-12-03 DIAGNOSIS — Z1231 Encounter for screening mammogram for malignant neoplasm of breast: Secondary | ICD-10-CM

## 2021-12-03 NOTE — Telephone Encounter (Signed)
Pt aware.

## 2021-12-06 HISTORY — PX: EYE SURGERY: SHX253

## 2021-12-10 DIAGNOSIS — H02403 Unspecified ptosis of bilateral eyelids: Secondary | ICD-10-CM | POA: Diagnosis not present

## 2021-12-10 DIAGNOSIS — H02834 Dermatochalasis of left upper eyelid: Secondary | ICD-10-CM | POA: Diagnosis not present

## 2021-12-10 DIAGNOSIS — H02831 Dermatochalasis of right upper eyelid: Secondary | ICD-10-CM | POA: Diagnosis not present

## 2021-12-10 DIAGNOSIS — H5340 Unspecified visual field defects: Secondary | ICD-10-CM | POA: Diagnosis not present

## 2021-12-20 ENCOUNTER — Other Ambulatory Visit (HOSPITAL_BASED_OUTPATIENT_CLINIC_OR_DEPARTMENT_OTHER): Payer: Self-pay | Admitting: Family Medicine

## 2022-01-08 DIAGNOSIS — M25561 Pain in right knee: Secondary | ICD-10-CM | POA: Diagnosis not present

## 2022-01-08 DIAGNOSIS — M1712 Unilateral primary osteoarthritis, left knee: Secondary | ICD-10-CM | POA: Diagnosis not present

## 2022-01-12 ENCOUNTER — Other Ambulatory Visit: Payer: Self-pay

## 2022-01-12 ENCOUNTER — Ambulatory Visit
Admission: RE | Admit: 2022-01-12 | Discharge: 2022-01-12 | Disposition: A | Discharge status: Home / Self Care | Payer: BC Managed Care – PPO | Source: Ambulatory Visit | Attending: Obstetrics & Gynecology | Admitting: Obstetrics & Gynecology

## 2022-01-12 DIAGNOSIS — Z1231 Encounter for screening mammogram for malignant neoplasm of breast: Secondary | ICD-10-CM | POA: Insufficient documentation

## 2022-01-12 DIAGNOSIS — M0609 Rheumatoid arthritis without rheumatoid factor, multiple sites: Secondary | ICD-10-CM | POA: Diagnosis not present

## 2022-01-12 DIAGNOSIS — Z79899 Other long term (current) drug therapy: Secondary | ICD-10-CM | POA: Diagnosis not present

## 2022-01-13 DIAGNOSIS — Z96652 Presence of left artificial knee joint: Secondary | ICD-10-CM | POA: Diagnosis not present

## 2022-01-13 DIAGNOSIS — M25662 Stiffness of left knee, not elsewhere classified: Secondary | ICD-10-CM | POA: Diagnosis not present

## 2022-01-13 DIAGNOSIS — M25562 Pain in left knee: Secondary | ICD-10-CM | POA: Diagnosis not present

## 2022-01-13 DIAGNOSIS — M6281 Muscle weakness (generalized): Secondary | ICD-10-CM | POA: Diagnosis not present

## 2022-01-15 DIAGNOSIS — H02403 Unspecified ptosis of bilateral eyelids: Secondary | ICD-10-CM | POA: Insufficient documentation

## 2022-01-15 DIAGNOSIS — H02831 Dermatochalasis of right upper eyelid: Secondary | ICD-10-CM | POA: Insufficient documentation

## 2022-01-15 DIAGNOSIS — H5340 Unspecified visual field defects: Secondary | ICD-10-CM | POA: Insufficient documentation

## 2022-01-18 DIAGNOSIS — M6281 Muscle weakness (generalized): Secondary | ICD-10-CM | POA: Diagnosis not present

## 2022-01-18 DIAGNOSIS — M25562 Pain in left knee: Secondary | ICD-10-CM | POA: Diagnosis not present

## 2022-01-18 DIAGNOSIS — M25662 Stiffness of left knee, not elsewhere classified: Secondary | ICD-10-CM | POA: Diagnosis not present

## 2022-01-18 DIAGNOSIS — Z96652 Presence of left artificial knee joint: Secondary | ICD-10-CM | POA: Diagnosis not present

## 2022-01-21 DIAGNOSIS — M25562 Pain in left knee: Secondary | ICD-10-CM | POA: Diagnosis not present

## 2022-01-21 DIAGNOSIS — M6281 Muscle weakness (generalized): Secondary | ICD-10-CM | POA: Diagnosis not present

## 2022-01-21 DIAGNOSIS — Z96652 Presence of left artificial knee joint: Secondary | ICD-10-CM | POA: Diagnosis not present

## 2022-01-21 DIAGNOSIS — M25662 Stiffness of left knee, not elsewhere classified: Secondary | ICD-10-CM | POA: Diagnosis not present

## 2022-01-25 DIAGNOSIS — Z96652 Presence of left artificial knee joint: Secondary | ICD-10-CM | POA: Diagnosis not present

## 2022-01-25 DIAGNOSIS — M6281 Muscle weakness (generalized): Secondary | ICD-10-CM | POA: Diagnosis not present

## 2022-01-25 DIAGNOSIS — M25662 Stiffness of left knee, not elsewhere classified: Secondary | ICD-10-CM | POA: Diagnosis not present

## 2022-01-25 DIAGNOSIS — M25562 Pain in left knee: Secondary | ICD-10-CM | POA: Diagnosis not present

## 2022-01-27 DIAGNOSIS — M6281 Muscle weakness (generalized): Secondary | ICD-10-CM | POA: Diagnosis not present

## 2022-01-27 DIAGNOSIS — Z96652 Presence of left artificial knee joint: Secondary | ICD-10-CM | POA: Diagnosis not present

## 2022-01-27 DIAGNOSIS — M25562 Pain in left knee: Secondary | ICD-10-CM | POA: Diagnosis not present

## 2022-01-27 DIAGNOSIS — M25662 Stiffness of left knee, not elsewhere classified: Secondary | ICD-10-CM | POA: Diagnosis not present

## 2022-02-02 NOTE — Progress Notes (Signed)
? ?PCP: [place], Blondell Reveal, MD ? ? ?Chief Complaint  ?Patient presents with  ? Gynecologic Exam  ? ? ?HPI: ?     [name] is a 61 y.o. [id number] whose LMP was No LMP recorded. Patient has had an ablation., presents today for her annual examination.  Her menses are absent due to menopause, no PMB. No vasomotor sx.  ? ?Sex activity: single partner, contraception - post menopausal status. She does not have vaginal dryness. ? ?Last Pap: 02/02/21  Results were: no abnormalities /neg HPV DNA.  ?No hx of abn paps with tx ? ?Last mammogram: 01/12/22 Results were: normal--routine follow-up in 12 months ?There is a FH of breast cancer in her mother and niece, genetic testing not indicated for pt. Her sister (mother of niece and nephew) had neg genetic testing. There is no FH of ovarian cancer. There is a FH of colon cancer in the pt's brother and nephew. The patient does do self-breast exams. ? ?Colonoscopy: 3/21 at [place];  Repeat due after 5 years due to [facility].  ? ?Tobacco use: 1 ppd for many yrs ?Alcohol use: none ?No drug use ?Exercise: moderately active ? ?She does not get adequate calcium but does get Vitamin D in her diet. ? ?Labs with PCP. See rheumatology for arthritis ? ? ?Patient Active Problem List  ? Diagnosis Date Noted  ? Cough 08/24/2021  ? Restless leg syndrome 08/03/2021  ? Hyperlipidemia 08/03/2021  ? Preoperative clearance 02/18/2021  ? Prediabetes 02/10/2021  ? Tobacco use disorder 02/10/2021  ? Family history of colon cancer requiring screening colonoscopy   ? Encounter for screening colonoscopy   ? Hypertension 02/22/2017  ? Sleep apnea 02/22/2017  ? Abdominal pain, acute, right upper quadrant 10/07/2014  ? Acute diarrhea 10/07/2014  ? Incomplete emptying of bladder 09/17/2013  ? Retention of urine 09/17/2013  ? Female stress incontinence 01/29/2013  ? Frequency of micturition 01/29/2013  ? Nocturia 01/29/2013  ? Urge incontinence 01/29/2013  ? Increased frequency of urination 01/29/2013   ? ? ?Past Surgical History:  ?Procedure Laterality Date  ? ABLATION    ? CARPECTOMY    ? right wrist  ? CHOLECYSTECTOMY    ? COLONOSCOPY WITH PROPOFOL N/A 02/22/2020  ? Procedure: COLONOSCOPY WITH PROPOFOL;  Surgeon: Lin Landsman, MD;  Location: [facility] ENDOSCOPY;  Service: Gastroenterology;  Laterality: N/A;  ? REPLACEMENT TOTAL KNEE Left   ? ? ?Family History  ?Problem Relation Age of Onset  ? Breast cancer Mother 80  ? Other Sister   ?     genetic testing neg (mother of niece and nephew with cancer)  ? Colon cancer Brother   ? Colon cancer Nephew 78  ? Breast cancer Niece 85  ? ? ?Social History  ? ?Socioeconomic History  ? Marital status: Married  ?  Spouse name: Not on file  ? Number of children: Not on file  ? Years of education: Not on file  ? Highest education level: Not on file  ?Occupational History  ? Not on file  ?Tobacco Use  ? Smoking status: Every Day  ? Smokeless tobacco: Never  ?Vaping Use  ? Vaping Use: Never used  ?Substance and Sexual Activity  ? Alcohol use: No  ? Drug use: No  ? Sexual activity: Yes  ?Other Topics Concern  ? Not on file  ?Social History Narrative  ? Not on file  ? ?Social Determinants of Health  ? ?Financial Resource Strain: Not on file  ?  Food Insecurity: Not on file  ?Transportation Needs: Not on file  ?Physical Activity: Not on file  ?Stress: Not on file  ?Social Connections: Not on file  ?Intimate Partner Violence: Not on file  ? ? ? ?Current Outpatient Medications:  ?  Ascorbic Acid (VITAMIN C) 1000 MG tablet, Take 1,000 mg by mouth daily., Disp: , Rfl:  ?  Cholecalciferol (VITAMIN D3) 125 MCG (5000 UT) CAPS, Take 1 capsule by mouth daily., Disp: , Rfl:  ?  gabapentin (NEURONTIN) 100 MG capsule, TAKE 5 CAPSULES (500 MG TOTAL) BY MOUTH AT BEDTIME., Disp: 150 capsule, Rfl: 1 ?  Golimumab (SIMPONI) 50 MG/0.5ML SOAJ, Inject into the skin., Disp: , Rfl:  ?  hydrochlorothiazide (HYDRODIURIL) 25 MG tablet, Take by mouth., Disp: , Rfl:  ?  Hydroxychloroquine Sulfate 100 MG  TABS, Take 2 tablets by mouth daily., Disp: , Rfl:  ?  zinc gluconate 50 MG tablet, Take 50 mg by mouth daily., Disp: , Rfl:  ?  ibuprofen (ADVIL) 800 MG tablet, Take 1 tablet by mouth 3 (three) times daily as needed. (Patient not taking: Reported on 02/03/2022), Disp: , Rfl:  ? ?Current Facility-Administered Medications:  ?  betamethasone acetate-betamethasone sodium phosphate (CELESTONE) injection 3 mg, 3 mg, Intramuscular, Once, Edrick Kins, DPM ? ? ? ? ?ROS: ? ?Review of Systems  ?Constitutional:  Negative for fatigue, fever and unexpected weight change.  ?Respiratory:  Negative for cough, shortness of breath and wheezing.   ?Cardiovascular:  Negative for chest pain, palpitations and leg swelling.  ?Gastrointestinal:  Negative for blood in stool, constipation, diarrhea, nausea and vomiting.  ?Endocrine: Negative for cold intolerance, heat intolerance and polyuria.  ?Genitourinary:  Negative for dyspareunia, dysuria, flank pain, frequency, genital sores, hematuria, menstrual problem, pelvic pain, urgency, vaginal bleeding, vaginal discharge and vaginal pain.  ?Musculoskeletal:  Negative for back pain, joint swelling and myalgias.  ?Skin:  Negative for rash.  ?Neurological:  Negative for dizziness, syncope, light-headedness, numbness and headaches.  ?Hematological:  Negative for adenopathy.  ?Psychiatric/Behavioral:  Negative for agitation, confusion, sleep disturbance and suicidal ideas. The patient is not nervous/anxious.   ?BREAST: No symptoms ? ? ? ?Objective: ?BP 122/70   Ht 5\' 3"  (1.6 m)   Wt 192 lb (87.1 kg)   BMI 34.01 kg/m?  ? ? ?Physical Exam ?Constitutional:   ?   Appearance: She is well-developed.  ?Genitourinary:  ?   Vulva normal.  ?   Right Labia: No rash, tenderness or lesions. ?   Left Labia: No tenderness, lesions or rash. ?   No vaginal discharge, erythema or tenderness.  ? ?   Right Adnexa: not tender and no mass present. ?   Left Adnexa: not tender and no mass present. ?   No cervical  friability or polyp.  ?   Uterus is not enlarged or tender.  ?Breasts: ?   Right: No mass, nipple discharge, skin change or tenderness.  ?   Left: No mass, nipple discharge, skin change or tenderness.  ?Neck:  ?   Thyroid: No thyromegaly.  ?Cardiovascular:  ?   Rate and Rhythm: Normal rate and regular rhythm.  ?   Heart sounds: Normal heart sounds. No murmur heard. ?Pulmonary:  ?   Effort: Pulmonary effort is normal.  ?   Breath sounds: Normal breath sounds.  ?Abdominal:  ?   Palpations: Abdomen is soft.  ?   Tenderness: There is no abdominal tenderness. There is no guarding or rebound.  ?Musculoskeletal:     ?  General: Normal range of motion.  ?   Cervical back: Normal range of motion.  ?Lymphadenopathy:  ?   Cervical: No cervical adenopathy.  ?Neurological:  ?   General: No focal deficit present.  ?   Mental Status: She is alert and oriented to person, place, and time.  ?   Cranial Nerves: No cranial nerve deficit.  ?Skin: ?   General: Skin is warm and dry.  ?Psychiatric:     ?   Mood and Affect: Mood normal.     ?   Behavior: Behavior normal.     ?   Thought Content: Thought content normal.     ?   Judgment: Judgment normal.  ?Vitals reviewed.  ? ? ?Assessment/Plan: ? ?Encounter for annual routine gynecological examination ? ?Encounter for screening mammogram for malignant neoplasm of breast; pt current on mammo ? ?Screening for colon cancer--pt current on colonoscopy; will cont to follow FH in case pt qualifies for cancer genetic testing.  ? ?        ?GYN counsel mammography screening, menopause, adequate intake of calcium and vitamin D, diet and exercise ? ?  F/U ? Return in about 1 year (around 02/04/2023). ? ?Maizie Garno B. Yuriy Cui, PA-C ?02/03/2022 ?9:13 AM ?

## 2022-02-03 ENCOUNTER — Other Ambulatory Visit: Payer: Self-pay

## 2022-02-03 ENCOUNTER — Ambulatory Visit (INDEPENDENT_AMBULATORY_CARE_PROVIDER_SITE_OTHER): Payer: BC Managed Care – PPO | Admitting: Obstetrics and Gynecology

## 2022-02-03 ENCOUNTER — Encounter: Payer: Self-pay | Admitting: Obstetrics and Gynecology

## 2022-02-03 VITALS — BP 122/70 | Ht 63.0 in | Wt 192.0 lb

## 2022-02-03 DIAGNOSIS — Z1211 Encounter for screening for malignant neoplasm of colon: Secondary | ICD-10-CM

## 2022-02-03 DIAGNOSIS — Z01419 Encounter for gynecological examination (general) (routine) without abnormal findings: Secondary | ICD-10-CM | POA: Diagnosis not present

## 2022-02-03 DIAGNOSIS — Z1231 Encounter for screening mammogram for malignant neoplasm of breast: Secondary | ICD-10-CM

## 2022-02-03 NOTE — Patient Instructions (Signed)
I value your feedback and you entrusting us with your care. If you get a [name] patient survey, I would appreciate you taking the time to let us know about your experience today. Thank you! ? ? ?

## 2022-02-15 DIAGNOSIS — L4050 Arthropathic psoriasis, unspecified: Secondary | ICD-10-CM | POA: Diagnosis not present

## 2022-02-15 DIAGNOSIS — I10 Essential (primary) hypertension: Secondary | ICD-10-CM | POA: Diagnosis not present

## 2022-02-15 DIAGNOSIS — H5340 Unspecified visual field defects: Secondary | ICD-10-CM | POA: Diagnosis not present

## 2022-02-15 DIAGNOSIS — H02403 Unspecified ptosis of bilateral eyelids: Secondary | ICD-10-CM | POA: Diagnosis not present

## 2022-02-15 DIAGNOSIS — H02834 Dermatochalasis of left upper eyelid: Secondary | ICD-10-CM | POA: Diagnosis not present

## 2022-02-15 DIAGNOSIS — H02831 Dermatochalasis of right upper eyelid: Secondary | ICD-10-CM | POA: Diagnosis not present

## 2022-02-15 DIAGNOSIS — F17200 Nicotine dependence, unspecified, uncomplicated: Secondary | ICD-10-CM | POA: Diagnosis not present

## 2022-02-15 DIAGNOSIS — E119 Type 2 diabetes mellitus without complications: Secondary | ICD-10-CM | POA: Diagnosis not present

## 2022-02-23 ENCOUNTER — Other Ambulatory Visit (HOSPITAL_BASED_OUTPATIENT_CLINIC_OR_DEPARTMENT_OTHER): Payer: Self-pay | Admitting: Family Medicine

## 2022-02-23 DIAGNOSIS — M1991 Primary osteoarthritis, unspecified site: Secondary | ICD-10-CM | POA: Diagnosis not present

## 2022-02-23 DIAGNOSIS — Z79899 Other long term (current) drug therapy: Secondary | ICD-10-CM | POA: Diagnosis not present

## 2022-02-23 DIAGNOSIS — M0609 Rheumatoid arthritis without rheumatoid factor, multiple sites: Secondary | ICD-10-CM | POA: Diagnosis not present

## 2022-03-09 DIAGNOSIS — M0609 Rheumatoid arthritis without rheumatoid factor, multiple sites: Secondary | ICD-10-CM | POA: Diagnosis not present

## 2022-04-18 ENCOUNTER — Other Ambulatory Visit (HOSPITAL_BASED_OUTPATIENT_CLINIC_OR_DEPARTMENT_OTHER): Payer: Self-pay | Admitting: Family Medicine

## 2022-04-22 NOTE — Telephone Encounter (Signed)
Called pt and tried to sch. Pt stated she will cb to sch due to having a lot of personal issues going on.

## 2022-05-05 DIAGNOSIS — M0609 Rheumatoid arthritis without rheumatoid factor, multiple sites: Secondary | ICD-10-CM | POA: Diagnosis not present

## 2022-05-11 DIAGNOSIS — Z043 Encounter for examination and observation following other accident: Secondary | ICD-10-CM | POA: Diagnosis not present

## 2022-05-11 DIAGNOSIS — R21 Rash and other nonspecific skin eruption: Secondary | ICD-10-CM | POA: Diagnosis not present

## 2022-05-17 IMAGING — MG MM DIGITAL SCREENING BILAT W/ TOMO AND CAD
8 series · 8 of 24 positions shown · non-contrast
Comparison: Previous exam(s).

CLINICAL DATA: Screening.

EXAM:
DIGITAL SCREENING BILATERAL MAMMOGRAM WITH TOMOSYNTHESIS AND CAD
TECHNIQUE: Bilateral screening digital craniocaudal and mediolateral oblique
mammograms were obtained. Bilateral screening digital breast
tomosynthesis was performed. The images were evaluated with
computer-aided detection.

[R MLO synth-2D]
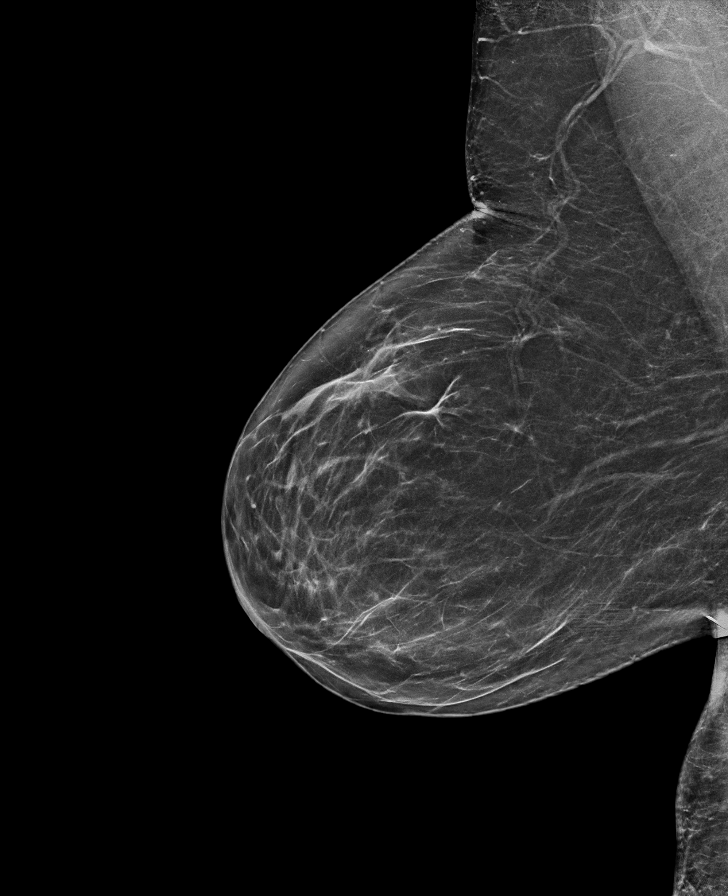

[R CC synth-2D]
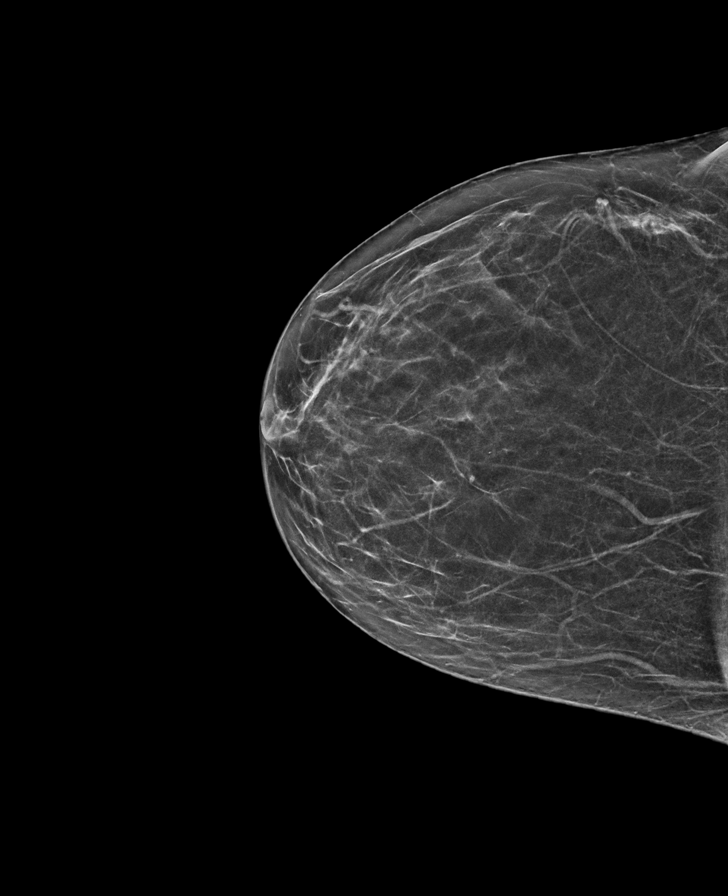

[L MLO synth-2D]
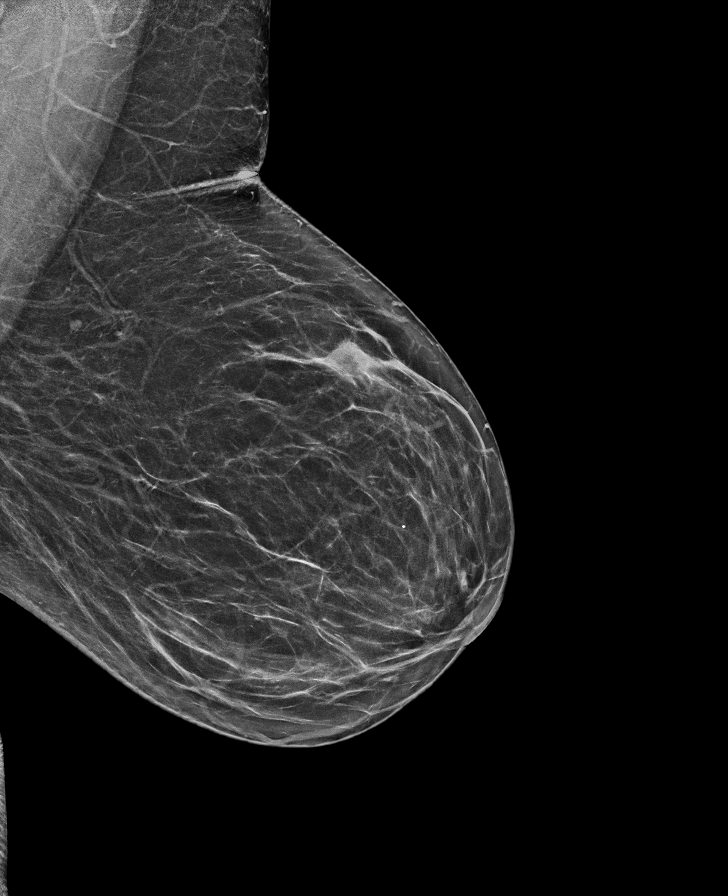

[L CC synth-2D]
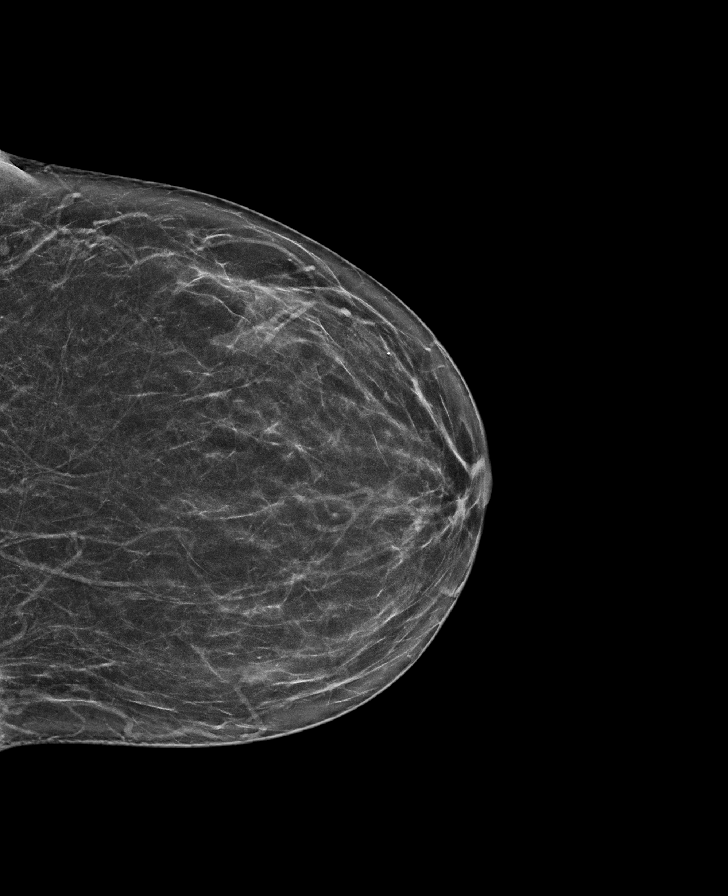

[L MLO tomo · tomo slice 37/72.0]
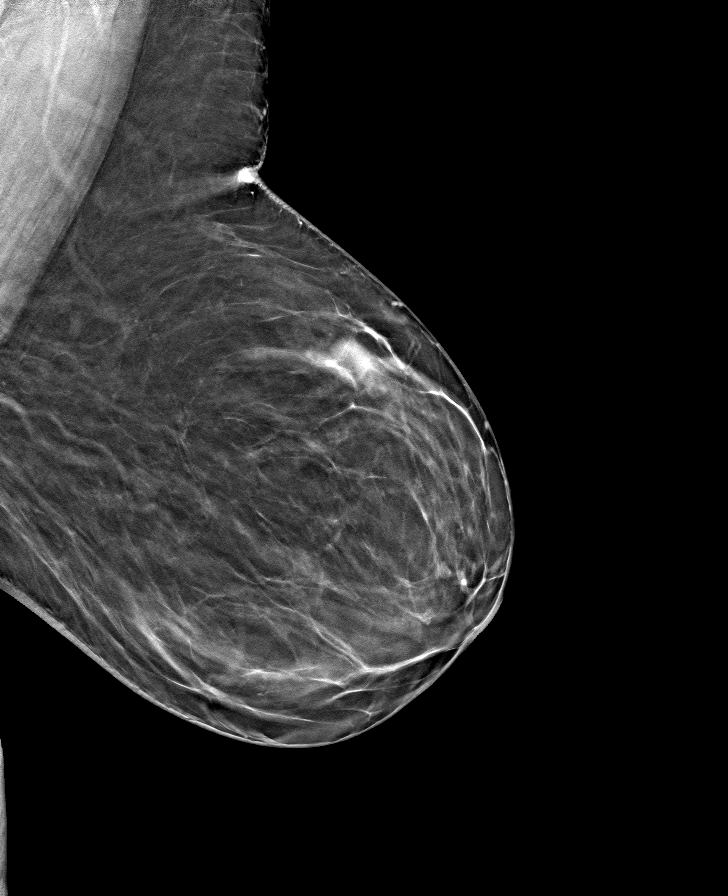

[R CC tomo · tomo slice 29/58.0]
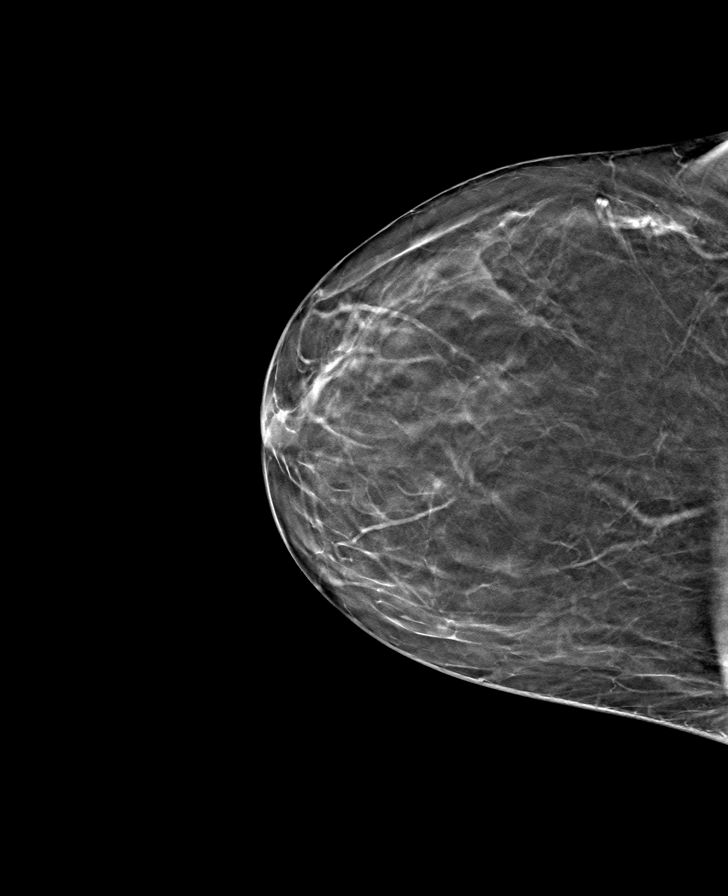

[R MLO tomo · tomo slice 36/71.0]
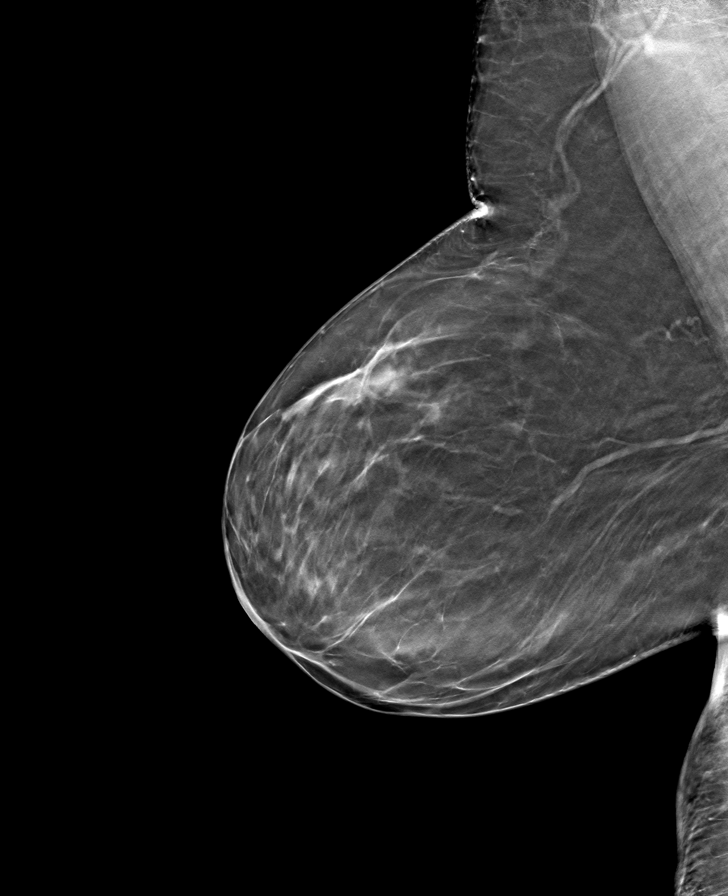

[L CC tomo · tomo slice 33/64.0]
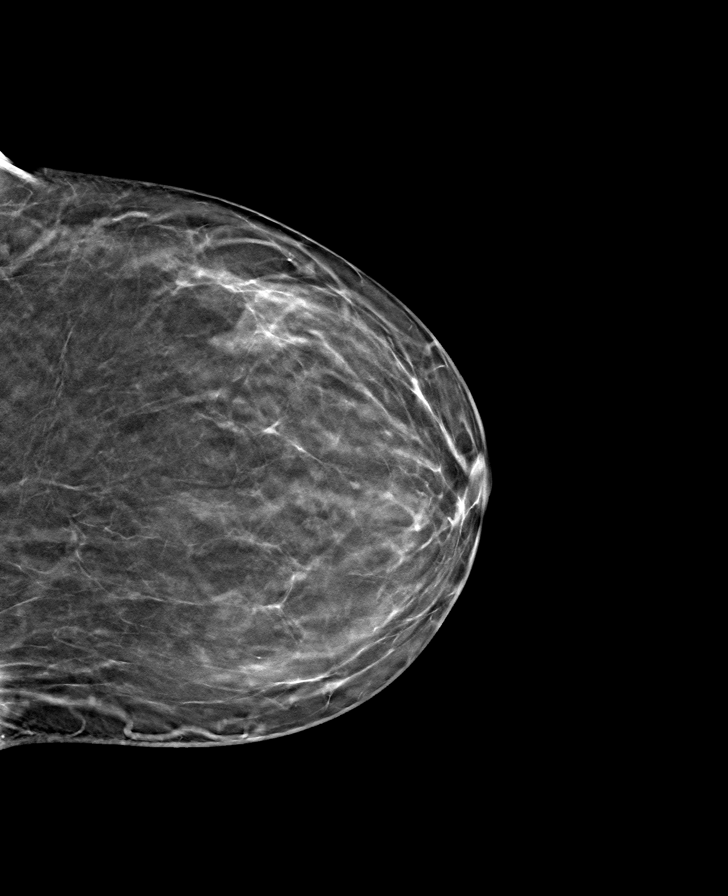

[8 of 24 positions shown; findings below may reference images not displayed]

ACR Breast Density Category b: There are scattered areas of
fibroglandular density.
FINDINGS: There are no findings suspicious for malignancy.
IMPRESSION: No mammographic evidence of malignancy. A result letter of this
screening mammogram will be mailed directly to the patient.

RECOMMENDATION:
Screening mammogram in one year. (Code:51-O-LD2)

BI-RADS CATEGORY  1: Negative.

## 2022-06-16 DIAGNOSIS — G4733 Obstructive sleep apnea (adult) (pediatric): Secondary | ICD-10-CM | POA: Diagnosis not present

## 2022-06-30 DIAGNOSIS — Z79899 Other long term (current) drug therapy: Secondary | ICD-10-CM | POA: Diagnosis not present

## 2022-06-30 DIAGNOSIS — M0609 Rheumatoid arthritis without rheumatoid factor, multiple sites: Secondary | ICD-10-CM | POA: Diagnosis not present

## 2022-07-13 DIAGNOSIS — G5602 Carpal tunnel syndrome, left upper limb: Secondary | ICD-10-CM | POA: Insufficient documentation

## 2022-07-15 DIAGNOSIS — M65312 Trigger thumb, left thumb: Secondary | ICD-10-CM | POA: Diagnosis not present

## 2022-07-15 DIAGNOSIS — R52 Pain, unspecified: Secondary | ICD-10-CM | POA: Diagnosis not present

## 2022-07-15 DIAGNOSIS — G5602 Carpal tunnel syndrome, left upper limb: Secondary | ICD-10-CM | POA: Diagnosis not present

## 2022-07-15 DIAGNOSIS — M13842 Other specified arthritis, left hand: Secondary | ICD-10-CM | POA: Diagnosis not present

## 2022-07-16 DIAGNOSIS — R52 Pain, unspecified: Secondary | ICD-10-CM | POA: Insufficient documentation

## 2022-07-16 DIAGNOSIS — M19042 Primary osteoarthritis, left hand: Secondary | ICD-10-CM | POA: Insufficient documentation

## 2022-07-16 DIAGNOSIS — M65312 Trigger thumb, left thumb: Secondary | ICD-10-CM | POA: Insufficient documentation

## 2022-07-17 DIAGNOSIS — G4733 Obstructive sleep apnea (adult) (pediatric): Secondary | ICD-10-CM | POA: Diagnosis not present

## 2022-08-02 ENCOUNTER — Other Ambulatory Visit (HOSPITAL_BASED_OUTPATIENT_CLINIC_OR_DEPARTMENT_OTHER): Payer: Self-pay | Admitting: Family Medicine

## 2022-08-17 DIAGNOSIS — G4733 Obstructive sleep apnea (adult) (pediatric): Secondary | ICD-10-CM | POA: Diagnosis not present

## 2022-08-25 DIAGNOSIS — R5383 Other fatigue: Secondary | ICD-10-CM | POA: Diagnosis not present

## 2022-08-25 DIAGNOSIS — Z111 Encounter for screening for respiratory tuberculosis: Secondary | ICD-10-CM | POA: Diagnosis not present

## 2022-08-25 DIAGNOSIS — Z79899 Other long term (current) drug therapy: Secondary | ICD-10-CM | POA: Diagnosis not present

## 2022-08-25 DIAGNOSIS — M0609 Rheumatoid arthritis without rheumatoid factor, multiple sites: Secondary | ICD-10-CM | POA: Diagnosis not present

## 2022-08-27 DIAGNOSIS — M0609 Rheumatoid arthritis without rheumatoid factor, multiple sites: Secondary | ICD-10-CM | POA: Diagnosis not present

## 2022-08-27 DIAGNOSIS — G5702 Lesion of sciatic nerve, left lower limb: Secondary | ICD-10-CM | POA: Diagnosis not present

## 2022-08-27 DIAGNOSIS — M1991 Primary osteoarthritis, unspecified site: Secondary | ICD-10-CM | POA: Diagnosis not present

## 2022-08-27 DIAGNOSIS — Z79899 Other long term (current) drug therapy: Secondary | ICD-10-CM | POA: Diagnosis not present

## 2022-09-14 DIAGNOSIS — G4733 Obstructive sleep apnea (adult) (pediatric): Secondary | ICD-10-CM | POA: Diagnosis not present

## 2022-09-16 DIAGNOSIS — G4733 Obstructive sleep apnea (adult) (pediatric): Secondary | ICD-10-CM | POA: Diagnosis not present

## 2022-09-17 LAB — LAB REPORT - SCANNED
A1c: 5.8
EGFR: 95

## 2022-10-06 DIAGNOSIS — G5602 Carpal tunnel syndrome, left upper limb: Secondary | ICD-10-CM | POA: Diagnosis not present

## 2022-10-06 DIAGNOSIS — M65312 Trigger thumb, left thumb: Secondary | ICD-10-CM | POA: Diagnosis not present

## 2022-10-15 DIAGNOSIS — G4733 Obstructive sleep apnea (adult) (pediatric): Secondary | ICD-10-CM | POA: Diagnosis not present

## 2022-10-20 DIAGNOSIS — M79642 Pain in left hand: Secondary | ICD-10-CM | POA: Insufficient documentation

## 2022-10-21 DIAGNOSIS — M0609 Rheumatoid arthritis without rheumatoid factor, multiple sites: Secondary | ICD-10-CM | POA: Diagnosis not present

## 2022-11-02 DIAGNOSIS — M79671 Pain in right foot: Secondary | ICD-10-CM | POA: Insufficient documentation

## 2022-11-02 DIAGNOSIS — G2581 Restless legs syndrome: Secondary | ICD-10-CM | POA: Diagnosis not present

## 2022-11-02 DIAGNOSIS — M069 Rheumatoid arthritis, unspecified: Secondary | ICD-10-CM | POA: Diagnosis not present

## 2022-11-14 DIAGNOSIS — G4733 Obstructive sleep apnea (adult) (pediatric): Secondary | ICD-10-CM | POA: Diagnosis not present

## 2022-11-25 DIAGNOSIS — Z79899 Other long term (current) drug therapy: Secondary | ICD-10-CM | POA: Diagnosis not present

## 2022-12-08 ENCOUNTER — Other Ambulatory Visit: Payer: Self-pay | Admitting: Obstetrics and Gynecology

## 2022-12-08 DIAGNOSIS — Z1231 Encounter for screening mammogram for malignant neoplasm of breast: Secondary | ICD-10-CM

## 2022-12-16 DIAGNOSIS — M0609 Rheumatoid arthritis without rheumatoid factor, multiple sites: Secondary | ICD-10-CM | POA: Diagnosis not present

## 2022-12-16 DIAGNOSIS — Z79899 Other long term (current) drug therapy: Secondary | ICD-10-CM | POA: Diagnosis not present

## 2022-12-17 LAB — LAB REPORT - SCANNED: EGFR: 87

## 2023-01-13 ENCOUNTER — Ambulatory Visit
Admission: RE | Admit: 2023-01-13 | Discharge: 2023-01-13 | Disposition: A | Discharge status: Home / Self Care | Payer: BC Managed Care – PPO | Source: Ambulatory Visit | Attending: Obstetrics and Gynecology | Admitting: Obstetrics and Gynecology

## 2023-01-13 DIAGNOSIS — Z1231 Encounter for screening mammogram for malignant neoplasm of breast: Secondary | ICD-10-CM | POA: Diagnosis not present

## 2023-01-14 DIAGNOSIS — M545 Low back pain, unspecified: Secondary | ICD-10-CM | POA: Diagnosis not present

## 2023-01-14 DIAGNOSIS — M25562 Pain in left knee: Secondary | ICD-10-CM | POA: Diagnosis not present

## 2023-01-17 ENCOUNTER — Other Ambulatory Visit: Payer: Self-pay | Admitting: Orthopedic Surgery

## 2023-01-17 DIAGNOSIS — T84033A Mechanical loosening of internal left knee prosthetic joint, initial encounter: Secondary | ICD-10-CM

## 2023-01-31 ENCOUNTER — Encounter
Admission: RE | Admit: 2023-01-31 | Discharge: 2023-01-31 | Disposition: A | Discharge status: Home / Self Care | Payer: BC Managed Care – PPO | Source: Ambulatory Visit | Attending: Orthopedic Surgery | Admitting: Orthopedic Surgery

## 2023-01-31 DIAGNOSIS — M25562 Pain in left knee: Secondary | ICD-10-CM | POA: Diagnosis not present

## 2023-01-31 DIAGNOSIS — T84033A Mechanical loosening of internal left knee prosthetic joint, initial encounter: Secondary | ICD-10-CM | POA: Diagnosis not present

## 2023-01-31 DIAGNOSIS — Z96652 Presence of left artificial knee joint: Secondary | ICD-10-CM | POA: Diagnosis not present

## 2023-01-31 MED ORDER — TECHNETIUM TC 99M MEDRONATE IV KIT
20.0000 | PACK | Freq: Once | INTRAVENOUS | Status: AC | PRN
  Administered 2023-01-31: 21.77 via INTRAVENOUS

## 2023-02-02 DIAGNOSIS — M25562 Pain in left knee: Secondary | ICD-10-CM | POA: Diagnosis not present

## 2023-02-06 NOTE — Progress Notes (Signed)
PCP: [place], Raymond J, MD   Chief Complaint  Patient presents with   Gynecologic Exam    No concerns    HPI:      [name] is a 62 y.o. [phone] whose LMP was No LMP recorded. Patient has had an ablation., presents today for her annual examination.  Her menses are absent due to menopause, no PMB. No vasomotor sx.   Sex activity: not currently sexually active. She does not have vaginal dryness.  Last Pap: 02/02/21  Results were: no abnormalities /neg HPV DNA.  No hx of abn paps with tx  Last mammogram: 01/13/23 Results were: normal--routine follow-up in 12 months There is a FH of breast cancer in her mother and niece, genetic testing not indicated for pt. Her sister (mother of niece and nephew) had neg genetic testing. There is no FH of ovarian cancer. There is a FH of colon cancer in the pt's brother and nephew. The patient does do self-breast exams.  Colonoscopy: 3/21 at [facility];  Repeat due after 5 years due to [facility].   Tobacco use: 1/2 -1 ppd for over 20 yrs; hasn't had lung CT.  Alcohol use: none No drug use Exercise: moderately active  She does get adequate calcium and Vitamin D in her diet.  Labs with PCP. Seeing rheumatology for arthritis. S/p LT knee replacement and just found out part of it has come undone. Is upset; waiting for mgmt info.    Patient Active Problem List   Diagnosis Date Noted   Cough 08/24/2021   Restless leg syndrome 08/03/2021   Hyperlipidemia 08/03/2021   Preoperative clearance 02/18/2021   Prediabetes 02/10/2021   Tobacco use disorder 02/10/2021   Family history of colon cancer requiring screening colonoscopy    Encounter for screening colonoscopy    Hypertension 02/22/2017   Sleep apnea 02/22/2017   Abdominal pain, acute, right upper quadrant 10/07/2014   Acute diarrhea 10/07/2014   Incomplete emptying of bladder 09/17/2013   Retention of urine 09/17/2013   Female stress incontinence 01/29/2013   Frequency of micturition  01/29/2013   Nocturia 01/29/2013   Urge incontinence 01/29/2013   Increased frequency of urination 01/29/2013    Past Surgical History:  Procedure Laterality Date   ABLATION     CARPECTOMY     right wrist   CHOLECYSTECTOMY     COLONOSCOPY WITH PROPOFOL N/A 02/22/2020   Procedure: COLONOSCOPY WITH PROPOFOL;  Surgeon: Toney Reil, MD;  Location: [facility] ENDOSCOPY;  Service: Gastroenterology;  Laterality: N/A;   REPLACEMENT TOTAL KNEE Left     Family History  Problem Relation Age of Onset   Breast cancer Mother 46   Other Sister        genetic testing neg (mother of niece and nephew with cancer)   Colon cancer Brother    Colon cancer Nephew 57   Breast cancer Niece 81    Social History   Socioeconomic History   Marital status: Married    Spouse name: Not on file   Number of children: Not on file   Years of education: Not on file   Highest education level: Not on file  Occupational History   Not on file  Tobacco Use   Smoking status: Every Day   Smokeless tobacco: Never  Vaping Use   Vaping Use: Never used  Substance and Sexual Activity   Alcohol use: No   Drug use: No   Sexual activity: Not Currently  Other Topics Concern  Not on file  Social History Narrative   Not on file   Social Determinants of Health   Financial Resource Strain: Not on file  Food Insecurity: Not on file  Transportation Needs: Not on file  Physical Activity: Not on file  Stress: Not on file  Social Connections: Not on file  Intimate Partner Violence: Not on file     Current Outpatient Medications:    Ascorbic Acid (VITAMIN C) 1000 MG tablet, Take 1,000 mg by mouth daily., Disp: , Rfl:    Cholecalciferol (VITAMIN D3) 125 MCG (5000 UT) CAPS, Take 1 capsule by mouth daily., Disp: , Rfl:    gabapentin (NEURONTIN) 100 MG capsule, TAKE 5 CAPSULES (500 MG TOTAL) BY MOUTH AT BEDTIME., Disp: 150 capsule, Rfl: 1   golimumab (SIMPONI ARIA) 50 MG/4ML SOLN injection, 2mg /kg Intravenous  every 8 weeks, Disp: , Rfl:    hydrochlorothiazide (HYDRODIURIL) 25 MG tablet, Take by mouth., Disp: , Rfl:    Hydroxychloroquine Sulfate 100 MG TABS, Take 2 tablets by mouth daily., Disp: , Rfl:    ibuprofen (ADVIL) 800 MG tablet, Take 1 tablet by mouth 3 (three) times daily as needed., Disp: , Rfl:    methocarbamol (ROBAXIN) 500 MG tablet, Take 500 mg by mouth daily., Disp: , Rfl:    zinc gluconate 50 MG tablet, Take 50 mg by mouth daily., Disp: , Rfl:   Current Facility-Administered Medications:    betamethasone acetate-betamethasone sodium phosphate (CELESTONE) injection 3 mg, 3 mg, Intramuscular, Once, Evans, Brent M, DPM     ROS:  Review of Systems  Constitutional:  Negative for fatigue, fever and unexpected weight change.  Respiratory:  Negative for cough, shortness of breath and wheezing.   Cardiovascular:  Negative for chest pain, palpitations and leg swelling.  Gastrointestinal:  Negative for blood in stool, constipation, diarrhea, nausea and vomiting.  Endocrine: Negative for cold intolerance, heat intolerance and polyuria.  Genitourinary:  Negative for dyspareunia, dysuria, flank pain, frequency, genital sores, hematuria, menstrual problem, pelvic pain, urgency, vaginal bleeding, vaginal discharge and vaginal pain.  Musculoskeletal:  Positive for arthralgias. Negative for back pain, joint swelling and myalgias.  Skin:  Negative for rash.  Neurological:  Negative for dizziness, syncope, light-headedness, numbness and headaches.  Hematological:  Negative for adenopathy.  Psychiatric/Behavioral:  Negative for agitation, confusion, sleep disturbance and suicidal ideas. The patient is not nervous/anxious.    BREAST: No symptoms    Objective: BP 100/64   Ht 5\' 3"  (1.6 m)   Wt 202 lb (91.6 kg)   BMI 35.78 kg/m    Physical Exam Constitutional:      Appearance: She is well-developed.  Genitourinary:     Vulva normal.     Right Labia: No rash, tenderness or lesions.     Left Labia: No tenderness, lesions or rash.    No vaginal discharge, erythema or tenderness.      Right Adnexa: not tender and no mass present.    Left Adnexa: not tender and no mass present.    No cervical friability or polyp.     Uterus is not enlarged or tender.  Breasts:    Right: No mass, nipple discharge, skin change or tenderness.     Left: No mass, nipple discharge, skin change or tenderness.  Neck:     Thyroid: No thyromegaly.  Cardiovascular:     Rate and Rhythm: Normal rate and regular rhythm.     Heart sounds: Normal heart sounds. No murmur heard. Pulmonary:     Effort:  Pulmonary effort is normal.     Breath sounds: Normal breath sounds.  Abdominal:     Palpations: Abdomen is soft.     Tenderness: There is no abdominal tenderness. There is no guarding or rebound.  Musculoskeletal:        General: Normal range of motion.     Cervical back: Normal range of motion.  Lymphadenopathy:     Cervical: No cervical adenopathy.  Neurological:     General: No focal deficit present.     Mental Status: She is alert and oriented to person, place, and time.     Cranial Nerves: No cranial nerve deficit.  Skin:    General: Skin is warm and dry.  Psychiatric:        Mood and Affect: Mood normal.        Behavior: Behavior normal.        Thought Content: Thought content normal.        Judgment: Judgment normal.  Vitals reviewed.     Assessment/Plan:  Encounter for annual routine gynecological examination  Encounter for screening mammogram for malignant neoplasm of breast; pt current on mammo  Tobacco use disorder--offered lung CT. Pt to consider and f/u prn.          GYN counsel mammography screening, menopause, adequate intake of calcium and vitamin D, diet and exercise    F/U  Return in about 1 year (around 02/07/2024).  Delfin Squillace B. Malkie Wille, PA-C 02/07/2023 5:04 PM

## 2023-02-07 ENCOUNTER — Encounter: Payer: Self-pay | Admitting: Obstetrics and Gynecology

## 2023-02-07 ENCOUNTER — Ambulatory Visit (INDEPENDENT_AMBULATORY_CARE_PROVIDER_SITE_OTHER): Payer: BC Managed Care – PPO | Admitting: Obstetrics and Gynecology

## 2023-02-07 VITALS — BP 100/64 | Ht 63.0 in | Wt 202.0 lb

## 2023-02-07 DIAGNOSIS — Z01419 Encounter for gynecological examination (general) (routine) without abnormal findings: Secondary | ICD-10-CM

## 2023-02-07 DIAGNOSIS — F17200 Nicotine dependence, unspecified, uncomplicated: Secondary | ICD-10-CM

## 2023-02-07 DIAGNOSIS — Z1231 Encounter for screening mammogram for malignant neoplasm of breast: Secondary | ICD-10-CM

## 2023-02-07 NOTE — Patient Instructions (Signed)
I value your feedback and you entrusting us with your care. If you get a [facility] patient survey, I would appreciate you taking the time to let us know about your experience today. Thank you! ? ? ?

## 2023-02-10 DIAGNOSIS — Z79899 Other long term (current) drug therapy: Secondary | ICD-10-CM | POA: Diagnosis not present

## 2023-02-10 DIAGNOSIS — M0609 Rheumatoid arthritis without rheumatoid factor, multiple sites: Secondary | ICD-10-CM | POA: Diagnosis not present

## 2023-02-11 LAB — LAB REPORT - SCANNED: EGFR: 70

## 2023-02-14 DIAGNOSIS — M1991 Primary osteoarthritis, unspecified site: Secondary | ICD-10-CM | POA: Diagnosis not present

## 2023-02-14 DIAGNOSIS — Z79899 Other long term (current) drug therapy: Secondary | ICD-10-CM | POA: Diagnosis not present

## 2023-02-14 DIAGNOSIS — Z13220 Encounter for screening for lipoid disorders: Secondary | ICD-10-CM | POA: Diagnosis not present

## 2023-02-14 DIAGNOSIS — M0609 Rheumatoid arthritis without rheumatoid factor, multiple sites: Secondary | ICD-10-CM | POA: Diagnosis not present

## 2023-02-14 DIAGNOSIS — M25562 Pain in left knee: Secondary | ICD-10-CM | POA: Diagnosis not present

## 2023-02-15 DIAGNOSIS — M25562 Pain in left knee: Secondary | ICD-10-CM | POA: Diagnosis not present

## 2023-03-05 DIAGNOSIS — J111 Influenza due to unidentified influenza virus with other respiratory manifestations: Secondary | ICD-10-CM | POA: Diagnosis not present

## 2023-04-07 DIAGNOSIS — M0609 Rheumatoid arthritis without rheumatoid factor, multiple sites: Secondary | ICD-10-CM | POA: Diagnosis not present

## 2023-04-13 DIAGNOSIS — G4733 Obstructive sleep apnea (adult) (pediatric): Secondary | ICD-10-CM | POA: Diagnosis not present

## 2023-04-18 DIAGNOSIS — M0609 Rheumatoid arthritis without rheumatoid factor, multiple sites: Secondary | ICD-10-CM | POA: Diagnosis not present

## 2023-04-18 DIAGNOSIS — M1991 Primary osteoarthritis, unspecified site: Secondary | ICD-10-CM | POA: Diagnosis not present

## 2023-04-18 DIAGNOSIS — Z79899 Other long term (current) drug therapy: Secondary | ICD-10-CM | POA: Diagnosis not present

## 2023-05-14 DIAGNOSIS — G4733 Obstructive sleep apnea (adult) (pediatric): Secondary | ICD-10-CM | POA: Diagnosis not present

## 2023-06-02 DIAGNOSIS — M0609 Rheumatoid arthritis without rheumatoid factor, multiple sites: Secondary | ICD-10-CM | POA: Diagnosis not present

## 2023-06-03 LAB — HM DIABETES EYE EXAM

## 2023-06-13 DIAGNOSIS — G4733 Obstructive sleep apnea (adult) (pediatric): Secondary | ICD-10-CM | POA: Diagnosis not present

## 2023-07-28 DIAGNOSIS — Z79899 Other long term (current) drug therapy: Secondary | ICD-10-CM | POA: Diagnosis not present

## 2023-07-28 DIAGNOSIS — M0609 Rheumatoid arthritis without rheumatoid factor, multiple sites: Secondary | ICD-10-CM | POA: Diagnosis not present

## 2023-07-29 LAB — LAB REPORT - SCANNED: EGFR: 69

## 2023-08-30 DIAGNOSIS — D225 Melanocytic nevi of trunk: Secondary | ICD-10-CM | POA: Diagnosis not present

## 2023-08-30 DIAGNOSIS — D2262 Melanocytic nevi of left upper limb, including shoulder: Secondary | ICD-10-CM | POA: Diagnosis not present

## 2023-08-30 DIAGNOSIS — L578 Other skin changes due to chronic exposure to nonionizing radiation: Secondary | ICD-10-CM | POA: Diagnosis not present

## 2023-08-30 DIAGNOSIS — D2261 Melanocytic nevi of right upper limb, including shoulder: Secondary | ICD-10-CM | POA: Diagnosis not present

## 2023-09-15 DIAGNOSIS — G4733 Obstructive sleep apnea (adult) (pediatric): Secondary | ICD-10-CM | POA: Diagnosis not present

## 2023-09-22 DIAGNOSIS — R5383 Other fatigue: Secondary | ICD-10-CM | POA: Diagnosis not present

## 2023-09-22 DIAGNOSIS — Z79899 Other long term (current) drug therapy: Secondary | ICD-10-CM | POA: Diagnosis not present

## 2023-09-22 DIAGNOSIS — M0609 Rheumatoid arthritis without rheumatoid factor, multiple sites: Secondary | ICD-10-CM | POA: Diagnosis not present

## 2023-09-22 DIAGNOSIS — Z111 Encounter for screening for respiratory tuberculosis: Secondary | ICD-10-CM | POA: Diagnosis not present

## 2023-09-26 LAB — LAB REPORT - SCANNED: HM Hepatitis Screen: NEGATIVE

## 2023-10-05 ENCOUNTER — Ambulatory Visit: Payer: BC Managed Care – PPO | Admitting: Family Medicine

## 2023-10-05 ENCOUNTER — Encounter: Payer: Self-pay | Admitting: Family Medicine

## 2023-10-05 VITALS — BP 120/72 | HR 70 | Temp 98.5°F | Ht 63.0 in | Wt 207.0 lb

## 2023-10-05 DIAGNOSIS — Z0001 Encounter for general adult medical examination with abnormal findings: Secondary | ICD-10-CM | POA: Diagnosis not present

## 2023-10-05 DIAGNOSIS — Z1159 Encounter for screening for other viral diseases: Secondary | ICD-10-CM

## 2023-10-05 DIAGNOSIS — Z114 Encounter for screening for human immunodeficiency virus [HIV]: Secondary | ICD-10-CM

## 2023-10-05 DIAGNOSIS — F411 Generalized anxiety disorder: Secondary | ICD-10-CM

## 2023-10-05 DIAGNOSIS — I10 Essential (primary) hypertension: Secondary | ICD-10-CM

## 2023-10-05 DIAGNOSIS — R7303 Prediabetes: Secondary | ICD-10-CM

## 2023-10-05 DIAGNOSIS — Z23 Encounter for immunization: Secondary | ICD-10-CM

## 2023-10-05 DIAGNOSIS — E785 Hyperlipidemia, unspecified: Secondary | ICD-10-CM

## 2023-10-05 DIAGNOSIS — Z6836 Body mass index (BMI) 36.0-36.9, adult: Secondary | ICD-10-CM

## 2023-10-05 MED ORDER — FLUOXETINE HCL 10 MG PO CAPS: 10.0000 mg | ORAL_CAPSULE | Freq: Every day | ORAL | 1 refills | Status: DC

## 2023-10-05 NOTE — Assessment & Plan Note (Signed)
A1c and uACR ordered. Foot exam done. Vaccines UTD. Retinal eye exam ordered. Recommend heart healthy diet such as Mediterranean diet with whole grains, fruits, vegetable, fish, lean meats, nuts, and olive oil. Limit salt. Encouraged moderate walking, 3-5 times/week for 30-50 minutes each session. Aim for at least 150 minutes.week. Goal should be pace of 3 miles/hours, or walking 1.5 miles in 30 minutes. Seek medical care for urinary frequency, extreme thirst, vision changes, lightheadedness, dizziness.

## 2023-10-05 NOTE — Assessment & Plan Note (Signed)
Chronic. Fasting labs ordered. I recommend consuming a heart healthy diet such as Mediterranean diet or DASH diet with whole grains, fruits, vegetable, fish, lean meats, nuts, and olive oil. Limit sweets and processed foods. I also encourage moderate intensity exercise 150 minutes weekly. This is 3-5 times weekly for 30-50 minutes each session. Goal should be pace of 3 miles/hours, or walking 1.5 miles in 30 minutes.

## 2023-10-05 NOTE — Assessment & Plan Note (Signed)
BMI 36.67. She is working on her exercise regimen. Counseled on importance of weight management for overall health. Encouraged low calorie, heart healthy diet and moderate intensity exercise 150 minutes weekly. This is 3-5 times weekly for 30-50 minutes each session. Goal should be pace of 3 miles/hours, or walking 1.5 miles in 30 minutes and include strength training.

## 2023-10-05 NOTE — Progress Notes (Signed)
New Patient Office Visit  Subjective    Patient ID: Cheryl Shepherd, female    DOB: [birth year]  Age: 62 y.o. MRN: [id number]  CC:  Chief Complaint  Patient presents with   Establish Care    HPI Cheryl Shepherd presents to establish care. Oriented to practice routines and expectations. Has not seen a PCP in 2 years. PMH include psoriatic arthritis for which she sees a rheumatologist, HTN, HLD, anxiety, RLS well controlled on gabapentin. Concerns include anxiety for many years that is worse recently and heightened with driving. Denies panic attacks, SI/HI. Has tried Lexapro in the past.   Breast CA screening: Mammogram status: Completed 01/13/2023. Repeat every year Cervical CA screening: approximate date 02/02/2021 and was normal Colon CA screening: colonoscopy 3 years ago without abnormalities. Repeat 5y. Tobacco: smoker  (1 ppd x 50 yrs)  STI: declines Vaccines:  declines flu, TDaP, PNA today, Shingles next visit     10/05/2023   12:28 PM  GAD 7 : Generalized Anxiety Score  Nervous, Anxious, on Edge 1  Control/stop worrying 1  Worry too much - different things 1  Trouble relaxing 1  Restless 1  Easily annoyed or irritable 0  Afraid - awful might happen 1  Total GAD 7 Score 6  Anxiety Difficulty Not difficult at all       10/05/2023   12:27 PM 02/10/2021    4:17 PM  Depression screen PHQ 2/9  Decreased Interest 1 0  Down, Depressed, Hopeless 0 0  PHQ - 2 Score 1 0  Altered sleeping 2   Tired, decreased energy 2   Change in appetite 0   Feeling bad or failure about yourself  0   Trouble concentrating 0   Moving slowly or fidgety/restless 0   Suicidal thoughts 0   PHQ-9 Score 5   Difficult doing work/chores Not difficult at all       Outpatient Encounter Medications as of 10/05/2023  Medication Sig   Ascorbic Acid (VITAMIN C) 1000 MG tablet Take 1,000 mg by mouth daily.   Cholecalciferol (VITAMIN D3) 125 MCG (5000 UT) CAPS Take 1 capsule by mouth daily.    cyanocobalamin (VITAMIN B12) 1000 MCG tablet Take 1,000 mcg by mouth in the morning and at bedtime.   FLUoxetine (PROZAC) 10 MG capsule Take 1 capsule (10 mg total) by mouth daily.   gabapentin (NEURONTIN) 100 MG capsule TAKE 5 CAPSULES (500 MG TOTAL) BY MOUTH AT BEDTIME.   golimumab (SIMPONI ARIA) 50 MG/4ML SOLN injection 2mg /kg Intravenous every 8 weeks   hydrochlorothiazide (HYDRODIURIL) 25 MG tablet Take by mouth.   Hydroxychloroquine Sulfate 100 MG TABS Take 2 tablets by mouth daily.   methocarbamol (ROBAXIN) 500 MG tablet Take 500 mg by mouth as needed.   zinc gluconate 50 MG tablet Take 50 mg by mouth daily.   [DISCONTINUED] ibuprofen (ADVIL) 800 MG tablet Take 1 tablet by mouth 3 (three) times daily as needed.   Facility-Administered Encounter Medications as of 10/05/2023  Medication   betamethasone acetate-betamethasone sodium phosphate (CELESTONE) injection 3 mg    Past Medical History:  Diagnosis Date   Anxiety    Cancer (HCC)    skin   Hyperlipidemia    Restless leg syndrome    Rheumatoid arteritis (HCC)     Past Surgical History:  Procedure Laterality Date   ABLATION     CARPECTOMY     right wrist   CHOLECYSTECTOMY     COLONOSCOPY WITH PROPOFOL N/A 02/22/2020  Procedure: COLONOSCOPY WITH PROPOFOL;  Surgeon: Toney Reil, MD;  Location: [facility] ENDOSCOPY;  Service: Gastroenterology;  Laterality: N/A;   KNEE SURGERY  06/2021   total replacement on L-knee   REPLACEMENT TOTAL KNEE Left     Family History  Problem Relation Age of Onset   Breast cancer Mother 80   Other Sister        genetic testing neg (mother of niece and nephew with cancer)   Colon cancer Brother    Colon cancer Nephew 60   Breast cancer Niece 30    Social History   Socioeconomic History   Marital status: Married    Spouse name: Not on file   Number of children: Not on file   Years of education: Not on file   Highest education level: 12th grade  Occupational History   Not on  file  Tobacco Use   Smoking status: Every Day    Current packs/day: 1.00    Types: Cigarettes   Smokeless tobacco: Never   Tobacco comments:    Over 15+years  Vaping Use   Vaping status: Never Used  Substance and Sexual Activity   Alcohol use: No   Drug use: No   Sexual activity: Not Currently  Other Topics Concern   Not on file  Social History Narrative   Not on file   Social Determinants of Health   Financial Resource Strain: Low Risk  (10/03/2023)   Overall Financial Resource Strain (CARDIA)    Difficulty of Paying Living Expenses: Not hard at all  Food Insecurity: No Food Insecurity (10/03/2023)   Hunger Vital Sign    Worried About Running Out of Food in the Last Year: Never true    Ran Out of Food in the Last Year: Never true  Transportation Needs: No Transportation Needs (10/03/2023)   PRAPARE - Administrator, Civil Service (Medical): No    Lack of Transportation (Non-Medical): No  Physical Activity: Insufficiently Active (10/03/2023)   Exercise Vital Sign    Days of Exercise per Week: 2 days    Minutes of Exercise per Session: 30 min  Stress: Stress Concern Present (10/03/2023)   [employer] of Occupational Health - Occupational Stress Questionnaire    Feeling of Stress : To some extent  Social Connections: Unknown (10/03/2023)   Social Connection and Isolation Panel [NHANES]    Frequency of Communication with Friends and Family: More than three times a week    Frequency of Social Gatherings with Friends and Family: Twice a week    Attends Religious Services: Patient declined    Database administrator or Organizations: No    Attends Engineer, structural: Not on file    Marital Status: Married  Catering manager Violence: Not on file    Review of Systems  Constitutional: Negative.   HENT: Negative.    Eyes: Negative.   Respiratory: Negative.    Cardiovascular: Negative.   Gastrointestinal: Negative.   Genitourinary: Negative.    Musculoskeletal: Negative.   Skin: Negative.   Neurological: Negative.   Endo/Heme/Allergies: Negative.   Psychiatric/Behavioral:  The patient is nervous/anxious.   All other systems reviewed and are negative.       Objective    BP 120/72   Pulse 70   Temp 98.5 F (36.9 C) (Oral)   Ht 5\' 3"  (1.6 m)   Wt 207 lb (93.9 kg)   SpO2 98%   BMI 36.67 kg/m   Physical Exam Vitals and nursing note  reviewed.  Constitutional:      Appearance: Normal appearance. She is obese.  HENT:     Head: Normocephalic and atraumatic.     Right Ear: Tympanic membrane, ear canal and external ear normal.     Left Ear: Tympanic membrane, ear canal and external ear normal.     Nose: Nose normal.     Mouth/Throat:     Mouth: Mucous membranes are moist.     Pharynx: Oropharynx is clear.  Eyes:     Extraocular Movements: Extraocular movements intact.     Conjunctiva/sclera: Conjunctivae normal.     Pupils: Pupils are equal, round, and reactive to light.  Cardiovascular:     Rate and Rhythm: Normal rate and regular rhythm.     Pulses: Normal pulses.     Heart sounds: Normal heart sounds.  Pulmonary:     Effort: Pulmonary effort is normal.     Breath sounds: Normal breath sounds.  Abdominal:     General: Bowel sounds are normal.     Palpations: Abdomen is soft.  Musculoskeletal:        General: Normal range of motion.     Cervical back: Normal range of motion and neck supple.  Skin:    General: Skin is warm and dry.     Capillary Refill: Capillary refill takes less than 2 seconds.  Neurological:     General: No focal deficit present.     Mental Status: She is alert and oriented to person, place, and time. Mental status is at baseline.  Psychiatric:        Mood and Affect: Mood normal.        Behavior: Behavior normal.        Thought Content: Thought content normal.        Judgment: Judgment normal.         Assessment & Plan:   Problem List Items Addressed This Visit      Hypertension - Primary    Chronic, well controlled. Continue hydrochlorothiazide 25mg  daily. Fasting labs ordered. Recommend heart healthy diet such as Mediterranean diet with whole grains, fruits, vegetable, fish, lean meats, nuts, and olive oil. Limit salt. Encouraged moderate walking, 3-5 times/week for 30-50 minutes each session. Aim for at least 150 minutes.week. Goal should be pace of 3 miles/hours, or walking 1.5 miles in 30 minutes. Avoid tobacco products. Avoid excess alcohol. Take medications as prescribed and bring medications and blood pressure log with cuff to each office visit. Seek medical care for chest pain, palpitations, shortness of breath with exertion, dizziness/lightheadedness, vision changes, recurrent headaches, or swelling of extremities.       Relevant Orders   CBC with Differential/Platelet   COMPLETE METABOLIC PANEL WITH GFR   Lipid panel   Thyroid Panel With TSH   Hemoglobin A1c   Hepatitis C antibody   HIV Antibody (routine testing w rflx)   Microalbumin / creatinine urine ratio   Prediabetes    A1c and uACR ordered. Foot exam done. Vaccines UTD. Retinal eye exam ordered. Recommend heart healthy diet such as Mediterranean diet with whole grains, fruits, vegetable, fish, lean meats, nuts, and olive oil. Limit salt. Encouraged moderate walking, 3-5 times/week for 30-50 minutes each session. Aim for at least 150 minutes.week. Goal should be pace of 3 miles/hours, or walking 1.5 miles in 30 minutes. Seek medical care for urinary frequency, extreme thirst, vision changes, lightheadedness, dizziness.        Relevant Orders   CBC with Differential/Platelet   COMPLETE  METABOLIC PANEL WITH GFR   Lipid panel   Thyroid Panel With TSH   Hemoglobin A1c   Hepatitis C antibody   HIV Antibody (routine testing w rflx)   Microalbumin / creatinine urine ratio   Hyperlipidemia    Chronic. Fasting labs ordered. I recommend consuming a heart healthy diet such as Mediterranean  diet or DASH diet with whole grains, fruits, vegetable, fish, lean meats, nuts, and olive oil. Limit sweets and processed foods. I also encourage moderate intensity exercise 150 minutes weekly. This is 3-5 times weekly for 30-50 minutes each session. Goal should be pace of 3 miles/hours, or walking 1.5 miles in 30 minutes.      Relevant Orders   CBC with Differential/Platelet   COMPLETE METABOLIC PANEL WITH GFR   Lipid panel   Thyroid Panel With TSH   Hemoglobin A1c   Hepatitis C antibody   HIV Antibody (routine testing w rflx)   Microalbumin / creatinine urine ratio   Generalized anxiety disorder    GAD 6. Chronic. Has tried Lexapro. Discussed treatment options and she would like to try Prozac, start Prozac 10mg  daily. Denies SI/HI. Encouraged therapy. Follow up in 4 weeks or sooner if needed.      Relevant Medications   FLUoxetine (PROZAC) 10 MG capsule   Morbid obesity (HCC)    BMI 36.67. She is working on her exercise regimen. Counseled on importance of weight management for overall health. Encouraged low calorie, heart healthy diet and moderate intensity exercise 150 minutes weekly. This is 3-5 times weekly for 30-50 minutes each session. Goal should be pace of 3 miles/hours, or walking 1.5 miles in 30 minutes and include strength training.       Other Visit Diagnoses     Screening for HIV (human immunodeficiency virus)       Relevant Orders   HIV Antibody (routine testing w rflx)   Need for hepatitis C screening test       Relevant Orders   Hepatitis C antibody   Need for vaccination       Relevant Orders   Pneumococcal conjugate vaccine 20-valent (Prevnar 20) (Completed)   BMI 36.0-36.9,adult           Return in about 4 weeks (around 11/02/2023) for chronic follow-up with labs 1 week prior, anxiety/depression.   Park Meo, FNP

## 2023-10-05 NOTE — Assessment & Plan Note (Signed)
Chronic, well controlled. Continue hydrochlorothiazide 25mg  daily. Fasting labs ordered. Recommend heart healthy diet such as Mediterranean diet with whole grains, fruits, vegetable, fish, lean meats, nuts, and olive oil. Limit salt. Encouraged moderate walking, 3-5 times/week for 30-50 minutes each session. Aim for at least 150 minutes.week. Goal should be pace of 3 miles/hours, or walking 1.5 miles in 30 minutes. Avoid tobacco products. Avoid excess alcohol. Take medications as prescribed and bring medications and blood pressure log with cuff to each office visit. Seek medical care for chest pain, palpitations, shortness of breath with exertion, dizziness/lightheadedness, vision changes, recurrent headaches, or swelling of extremities.

## 2023-10-05 NOTE — Assessment & Plan Note (Addendum)
GAD 6. Chronic. Has tried Lexapro. Discussed treatment options and she would like to try Prozac, start Prozac 10mg  daily. Denies SI/HI. Encouraged therapy. Follow up in 4 weeks or sooner if needed.

## 2023-10-05 NOTE — Patient Instructions (Signed)
It was great to meet you today and I'm excited to have you join the [facility] practice. I hope you had a positive experience today! If you feel so inclined, please feel free to recommend our practice to friends and family. Mila Merry, FNP-C

## 2023-10-06 ENCOUNTER — Other Ambulatory Visit: Payer: BC Managed Care – PPO

## 2023-10-06 DIAGNOSIS — I10 Essential (primary) hypertension: Secondary | ICD-10-CM | POA: Diagnosis not present

## 2023-10-06 DIAGNOSIS — Z1159 Encounter for screening for other viral diseases: Secondary | ICD-10-CM | POA: Diagnosis not present

## 2023-10-06 DIAGNOSIS — E785 Hyperlipidemia, unspecified: Secondary | ICD-10-CM | POA: Diagnosis not present

## 2023-10-06 DIAGNOSIS — R7303 Prediabetes: Secondary | ICD-10-CM | POA: Diagnosis not present

## 2023-10-07 LAB — THYROID PANEL WITH TSH
Free Thyroxine Index: 2.1 (ref 1.4–3.8)
T3 Uptake: 26 % (ref 22–35)
T4, Total: 8.2 ug/dL (ref 5.1–11.9)
TSH: 3.32 m[IU]/L (ref 0.40–4.50)

## 2023-10-07 LAB — LIPID PANEL
Cholesterol: 231 mg/dL — ABNORMAL HIGH (ref <200)
HDL: 77 mg/dL (ref >=50)
LDL Cholesterol (Calc): 136 mg/dL — ABNORMAL HIGH
Non-HDL Cholesterol (Calc): 154 mg/dL — ABNORMAL HIGH (ref <130)
Total CHOL/HDL Ratio: 3 (calc) (ref <5.0)
Triglycerides: 84 mg/dL (ref <150)

## 2023-10-07 LAB — COMPLETE METABOLIC PANEL WITH GFR
AG Ratio: 1.6 (calc) (ref 1.0–2.5)
ALT: 18 U/L (ref 6–29)
AST: 18 U/L (ref 10–35)
Albumin: 4.2 g/dL (ref 3.6–5.1)
Alkaline phosphatase (APISO): 72 U/L (ref 37–153)
BUN: 14 mg/dL (ref 7–25)
CO2: 27 mmol/L (ref 20–32)
Calcium: 9.6 mg/dL (ref 8.6–10.4)
Chloride: 101 mmol/L (ref 98–110)
Creat: 0.82 mg/dL (ref 0.50–1.05)
Globulin: 2.6 g/dL (ref 1.9–3.7)
Glucose, Bld: 95 mg/dL (ref 65–99)
Potassium: 3.9 mmol/L (ref 3.5–5.3)
Sodium: 139 mmol/L (ref 135–146)
Total Bilirubin: 0.4 mg/dL (ref 0.2–1.2)
Total Protein: 6.8 g/dL (ref 6.1–8.1)
eGFR: 80 mL/min/{1.73_m2} (ref >=60)

## 2023-10-07 LAB — CBC WITH DIFFERENTIAL/PLATELET
Absolute Lymphocytes: 1634 {cells}/uL (ref 850–3900)
Absolute Monocytes: 554 {cells}/uL (ref 200–950)
Basophils Absolute: 32 {cells}/uL (ref 0–200)
Basophils Relative: 0.7 %
Eosinophils Absolute: 158 {cells}/uL (ref 15–500)
Eosinophils Relative: 3.5 %
HCT: 40.3 % (ref 35.0–45.0)
Hemoglobin: 13.4 g/dL (ref 11.7–15.5)
MCH: 31.4 pg (ref 27.0–33.0)
MCHC: 33.3 g/dL (ref 32.0–36.0)
MCV: 94.4 fL (ref 80.0–100.0)
MPV: 10.7 fL (ref 7.5–12.5)
Monocytes Relative: 12.3 %
Neutro Abs: 2124 {cells}/uL (ref 1500–7800)
Neutrophils Relative %: 47.2 %
Platelets: 230 10*3/uL (ref 140–400)
RBC: 4.27 10*6/uL (ref 3.80–5.10)
RDW: 13.2 % (ref 11.0–15.0)
Total Lymphocyte: 36.3 %
WBC: 4.5 10*3/uL (ref 3.8–10.8)

## 2023-10-07 LAB — MICROALBUMIN / CREATININE URINE RATIO
Creatinine, Urine: 85 mg/dL (ref 20–275)
Microalb, Ur: <0.2 mg/dL

## 2023-10-07 LAB — HEMOGLOBIN A1C
Hgb A1c MFr Bld: 5.8 %{Hb} — ABNORMAL HIGH (ref <5.7)
Mean Plasma Glucose: 120 mg/dL
eAG (mmol/L): 6.6 mmol/L

## 2023-10-07 LAB — HIV ANTIBODY (ROUTINE TESTING W REFLEX): HIV 1&2 Ab, 4th Generation: NONREACTIVE

## 2023-10-07 LAB — HEPATITIS C ANTIBODY: Hepatitis C Ab: NONREACTIVE

## 2023-10-11 ENCOUNTER — Other Ambulatory Visit: Payer: Self-pay | Admitting: Family Medicine

## 2023-10-11 DIAGNOSIS — E785 Hyperlipidemia, unspecified: Secondary | ICD-10-CM

## 2023-10-11 MED ORDER — ROSUVASTATIN CALCIUM 10 MG PO TABS: 10.0000 mg | ORAL_TABLET | Freq: Every day | ORAL | 2 refills | Status: DC

## 2023-10-13 DIAGNOSIS — L309 Dermatitis, unspecified: Secondary | ICD-10-CM | POA: Diagnosis not present

## 2023-10-13 DIAGNOSIS — L570 Actinic keratosis: Secondary | ICD-10-CM | POA: Diagnosis not present

## 2023-10-16 DIAGNOSIS — G4733 Obstructive sleep apnea (adult) (pediatric): Secondary | ICD-10-CM | POA: Diagnosis not present

## 2023-10-18 DIAGNOSIS — M545 Low back pain, unspecified: Secondary | ICD-10-CM | POA: Diagnosis not present

## 2023-10-18 DIAGNOSIS — M542 Cervicalgia: Secondary | ICD-10-CM | POA: Diagnosis not present

## 2023-10-18 DIAGNOSIS — Z79899 Other long term (current) drug therapy: Secondary | ICD-10-CM | POA: Diagnosis not present

## 2023-10-18 DIAGNOSIS — M0609 Rheumatoid arthritis without rheumatoid factor, multiple sites: Secondary | ICD-10-CM | POA: Diagnosis not present

## 2023-10-18 DIAGNOSIS — M1991 Primary osteoarthritis, unspecified site: Secondary | ICD-10-CM | POA: Diagnosis not present

## 2023-10-19 DIAGNOSIS — M5416 Radiculopathy, lumbar region: Secondary | ICD-10-CM | POA: Diagnosis not present

## 2023-10-19 DIAGNOSIS — M50321 Other cervical disc degeneration at C4-C5 level: Secondary | ICD-10-CM | POA: Diagnosis not present

## 2023-10-19 DIAGNOSIS — M9901 Segmental and somatic dysfunction of cervical region: Secondary | ICD-10-CM | POA: Diagnosis not present

## 2023-10-19 DIAGNOSIS — M9903 Segmental and somatic dysfunction of lumbar region: Secondary | ICD-10-CM | POA: Diagnosis not present

## 2023-10-25 DIAGNOSIS — M9901 Segmental and somatic dysfunction of cervical region: Secondary | ICD-10-CM | POA: Diagnosis not present

## 2023-10-25 DIAGNOSIS — M9903 Segmental and somatic dysfunction of lumbar region: Secondary | ICD-10-CM | POA: Diagnosis not present

## 2023-10-25 DIAGNOSIS — M5416 Radiculopathy, lumbar region: Secondary | ICD-10-CM | POA: Diagnosis not present

## 2023-10-25 DIAGNOSIS — M50321 Other cervical disc degeneration at C4-C5 level: Secondary | ICD-10-CM | POA: Diagnosis not present

## 2023-10-27 ENCOUNTER — Other Ambulatory Visit: Payer: Self-pay | Admitting: Family Medicine

## 2023-10-27 DIAGNOSIS — M5416 Radiculopathy, lumbar region: Secondary | ICD-10-CM | POA: Diagnosis not present

## 2023-10-27 DIAGNOSIS — M50321 Other cervical disc degeneration at C4-C5 level: Secondary | ICD-10-CM | POA: Diagnosis not present

## 2023-10-27 DIAGNOSIS — M9903 Segmental and somatic dysfunction of lumbar region: Secondary | ICD-10-CM | POA: Diagnosis not present

## 2023-10-27 DIAGNOSIS — M9901 Segmental and somatic dysfunction of cervical region: Secondary | ICD-10-CM | POA: Diagnosis not present

## 2023-10-28 ENCOUNTER — Encounter: Payer: Self-pay | Admitting: Family Medicine

## 2023-10-29 NOTE — Telephone Encounter (Signed)
Notes were not scanned as of 10/29/23

## 2023-10-31 ENCOUNTER — Other Ambulatory Visit: Payer: BC Managed Care – PPO

## 2023-10-31 DIAGNOSIS — M9901 Segmental and somatic dysfunction of cervical region: Secondary | ICD-10-CM | POA: Diagnosis not present

## 2023-10-31 DIAGNOSIS — M5416 Radiculopathy, lumbar region: Secondary | ICD-10-CM | POA: Diagnosis not present

## 2023-10-31 DIAGNOSIS — E785 Hyperlipidemia, unspecified: Secondary | ICD-10-CM

## 2023-10-31 DIAGNOSIS — M9903 Segmental and somatic dysfunction of lumbar region: Secondary | ICD-10-CM | POA: Diagnosis not present

## 2023-10-31 DIAGNOSIS — M50321 Other cervical disc degeneration at C4-C5 level: Secondary | ICD-10-CM | POA: Diagnosis not present

## 2023-11-01 ENCOUNTER — Ambulatory Visit: Payer: BC Managed Care – PPO | Admitting: Family Medicine

## 2023-11-01 ENCOUNTER — Encounter: Payer: Self-pay | Admitting: Family Medicine

## 2023-11-01 VITALS — BP 124/78 | HR 66 | Temp 98.1°F | Ht 63.0 in | Wt 203.2 lb

## 2023-11-01 DIAGNOSIS — F411 Generalized anxiety disorder: Secondary | ICD-10-CM

## 2023-11-01 DIAGNOSIS — E785 Hyperlipidemia, unspecified: Secondary | ICD-10-CM | POA: Diagnosis not present

## 2023-11-01 LAB — COMPLETE METABOLIC PANEL WITH GFR
AG Ratio: 1.5 (calc) (ref 1.0–2.5)
ALT: 15 U/L (ref 6–29)
AST: 15 U/L (ref 10–35)
Albumin: 4.1 g/dL (ref 3.6–5.1)
Alkaline phosphatase (APISO): 60 U/L (ref 37–153)
BUN: 17 mg/dL (ref 7–25)
CO2: 28 mmol/L (ref 20–32)
Calcium: 9.6 mg/dL (ref 8.6–10.4)
Chloride: 100 mmol/L (ref 98–110)
Creat: 0.91 mg/dL (ref 0.50–1.05)
Globulin: 2.8 g/dL (ref 1.9–3.7)
Glucose, Bld: 91 mg/dL (ref 65–99)
Potassium: 4.1 mmol/L (ref 3.5–5.3)
Sodium: 137 mmol/L (ref 135–146)
Total Bilirubin: 0.4 mg/dL (ref 0.2–1.2)
Total Protein: 6.9 g/dL (ref 6.1–8.1)
eGFR: 71 mL/min/{1.73_m2} (ref >=60)

## 2023-11-01 LAB — LIPID PANEL
Cholesterol: 200 mg/dL — ABNORMAL HIGH (ref <200)
HDL: 74 mg/dL (ref >=50)
LDL Cholesterol (Calc): 108 mg/dL — ABNORMAL HIGH
Non-HDL Cholesterol (Calc): 126 mg/dL (ref <130)
Total CHOL/HDL Ratio: 2.7 (calc) (ref <5.0)
Triglycerides: 90 mg/dL (ref <150)

## 2023-11-01 NOTE — Assessment & Plan Note (Addendum)
Chronic. Discussed ASCVD risk and she would like to continue lifestyle modifications. She is working very hard and highly motivated with diet and exercise. Will follow up with recheck in 3 months.  I recommend consuming a heart healthy diet such as Mediterranean diet or DASH diet with whole grains, fruits, vegetable, fish, lean meats, nuts, and olive oil. Limit sweets and processed foods. I also encourage moderate intensity exercise 150 minutes weekly. This is 3-5 times weekly for 30-50 minutes each session. Goal should be pace of 3 miles/hours, or walking 1.5 miles in 30 minutes. The 10-year ASCVD risk score (Arnett DK, et al., 2019) is: 17.3%

## 2023-11-01 NOTE — Assessment & Plan Note (Signed)
GAD 0. Chronic. Improved on 10mg  daily Prozac. Continue and follow up as needed.

## 2023-11-01 NOTE — Progress Notes (Signed)
Subjective:  HPI: Cheryl Shepherd is a 62 y.o. female presenting on 11/01/2023 for Follow-up   HPI Patient is in today for follow up for anxiety and hyperlipidemia. She has not restarted Rosuvastatin but has been eating a heart healthy diet and riding indoor bike 30 minutes daily, 4-5 days weekly. She has started red yeast rice and CoQ10. Would like to continue lifestyle modifications. She is working with a Engineer, maintenance (IT) at [facility] on her overall health. She also reports improvement in anxiety, nail biting, and other symptoms on Prozac without side effects.   The 10-year ASCVD risk score (Arnett DK, et al., 2019) is: 17.3%   Values used to calculate the score:     Age: 50 years     Sex: Female     Is Non-Hispanic African American: No     Diabetic: Yes     Tobacco smoker: Yes     Systolic Blood Pressure: 124 mmHg     Is BP treated: Yes     HDL Cholesterol: 74 mg/dL     Total Cholesterol: 200 mg/dL     40/98/1191   47:82 AM 10/05/2023   12:28 PM  GAD 7 : Generalized Anxiety Score  Nervous, Anxious, on Edge 0 1  Control/stop worrying 0 1  Worry too much - different things 0 1  Trouble relaxing 0 1  Restless 0 1  Easily annoyed or irritable 0 0  Afraid - awful might happen 0 1  Total GAD 7 Score 0 6  Anxiety Difficulty Not difficult at all Not difficult at all       Review of Systems  All other systems reviewed and are negative.   Relevant past medical history reviewed and updated as indicated.   Past Medical History:  Diagnosis Date   Anxiety    Cancer (HCC)    skin   Hyperlipidemia    Restless leg syndrome    Rheumatoid arteritis (HCC)      Past Surgical History:  Procedure Laterality Date   ABLATION     CARPECTOMY     right wrist   CHOLECYSTECTOMY     COLONOSCOPY WITH PROPOFOL N/A 02/22/2020   Procedure: COLONOSCOPY WITH PROPOFOL;  Surgeon: Toney Reil, MD;  Location: [facility] ENDOSCOPY;  Service: Gastroenterology;  Laterality:  N/A;   KNEE SURGERY  06/2021   total replacement on L-knee   REPLACEMENT TOTAL KNEE Left     Allergies and medications reviewed and updated.   Current Outpatient Medications:    Ascorbic Acid (VITAMIN C) 1000 MG tablet, Take 1,000 mg by mouth daily., Disp: , Rfl:    Cholecalciferol (VITAMIN D3) 125 MCG (5000 UT) CAPS, Take 1 capsule by mouth daily., Disp: , Rfl:    cyanocobalamin (VITAMIN B12) 1000 MCG tablet, Take 1,000 mcg by mouth in the morning and at bedtime., Disp: , Rfl:    FLUoxetine (PROZAC) 10 MG capsule, TAKE 1 CAPSULE BY MOUTH EVERY DAY, Disp: 90 capsule, Rfl: 1   gabapentin (NEURONTIN) 100 MG capsule, TAKE 5 CAPSULES (500 MG TOTAL) BY MOUTH AT BEDTIME., Disp: 150 capsule, Rfl: 1   golimumab (SIMPONI ARIA) 50 MG/4ML SOLN injection, 2mg /kg Intravenous every 8 weeks, Disp: , Rfl:    hydrochlorothiazide (HYDRODIURIL) 25 MG tablet, Take by mouth., Disp: , Rfl:    Hydroxychloroquine Sulfate 100 MG TABS, Take 2 tablets by mouth daily., Disp: , Rfl:    methocarbamol (ROBAXIN) 500 MG tablet, Take 500 mg by mouth as needed., Disp: , Rfl:  zinc gluconate 50 MG tablet, Take 50 mg by mouth daily., Disp: , Rfl:    rosuvastatin (CRESTOR) 10 MG tablet, Take 1 tablet (10 mg total) by mouth daily. (Patient not taking: Reported on 11/01/2023), Disp: 90 tablet, Rfl: 3  Current Facility-Administered Medications:    betamethasone acetate-betamethasone sodium phosphate (CELESTONE) injection 3 mg, 3 mg, Intramuscular, Once, Evans, Larena Glassman, DPM  Allergies  Allergen Reactions   Cortisone Swelling, Hives and Other (See Comments)    Flushing   Penicillin G Other (See Comments)   Sulfa Antibiotics Nausea Only and Other (See Comments)   Sulfacetamide     Other reaction(s): NAUSEA   Penicillins Rash and Other (See Comments)    Objective:   BP 124/78 (BP Location: Left Arm)   Pulse 66   Temp 98.1 F (36.7 C)   Ht 5\' 3"  (1.6 m)   Wt 203 lb 4 oz (92.2 kg)   SpO2 98%   BMI 36.00 kg/m       11/01/2023   11:18 AM 10/05/2023   10:44 AM 02/07/2023    3:15 PM  Vitals with BMI  Height 5\' 3"  5\' 3"  5\' 3"   Weight 203 lbs 4 oz 207 lbs 202 lbs  BMI 36.01 36.68 35.79  Systolic 124 120 295  Diastolic 78 72 64  Pulse 66 70      Physical Exam Vitals and nursing note reviewed.  Constitutional:      Appearance: Normal appearance. She is normal weight.  HENT:     Head: Normocephalic and atraumatic.  Cardiovascular:     Rate and Rhythm: Normal rate and regular rhythm.     Pulses: Normal pulses.     Heart sounds: Normal heart sounds.  Pulmonary:     Effort: Pulmonary effort is normal.     Breath sounds: Normal breath sounds.  Skin:    General: Skin is warm and dry.  Neurological:     General: No focal deficit present.     Mental Status: She is alert and oriented to person, place, and time. Mental status is at baseline.  Psychiatric:        Mood and Affect: Mood normal.        Behavior: Behavior normal.        Thought Content: Thought content normal.        Judgment: Judgment normal.     Assessment & Plan:  Generalized anxiety disorder Assessment & Plan: GAD 0. Chronic. Improved on 10mg  daily Prozac. Continue and follow up as needed.   Hyperlipidemia, unspecified hyperlipidemia type Assessment & Plan: Chronic. Discussed ASCVD risk and she would like to continue lifestyle modifications. She is working very hard and highly motivated with diet and exercise. Will follow up with recheck in 3 months.  I recommend consuming a heart healthy diet such as Mediterranean diet or DASH diet with whole grains, fruits, vegetable, fish, lean meats, nuts, and olive oil. Limit sweets and processed foods. I also encourage moderate intensity exercise 150 minutes weekly. This is 3-5 times weekly for 30-50 minutes each session. Goal should be pace of 3 miles/hours, or walking 1.5 miles in 30 minutes. The 10-year ASCVD risk score (Arnett DK, et al., 2019) is: 17.3%       Follow up plan:  Return in about 3 months (around 02/01/2024) for chronic follow-up with labs 1 week prior.  Park Meo, FNP

## 2023-11-02 ENCOUNTER — Telehealth: Payer: Self-pay

## 2023-11-02 NOTE — Telephone Encounter (Signed)
Advised pt of results, she stated that Amber had already reviewed these with her.

## 2023-11-15 DIAGNOSIS — G4733 Obstructive sleep apnea (adult) (pediatric): Secondary | ICD-10-CM | POA: Diagnosis not present

## 2023-11-20 ENCOUNTER — Encounter: Payer: Self-pay | Admitting: Family Medicine

## 2023-11-21 ENCOUNTER — Other Ambulatory Visit: Payer: Self-pay | Admitting: Family Medicine

## 2023-11-21 DIAGNOSIS — M0609 Rheumatoid arthritis without rheumatoid factor, multiple sites: Secondary | ICD-10-CM | POA: Diagnosis not present

## 2023-11-21 MED ORDER — GABAPENTIN 100 MG PO CAPS
500.0000 mg | ORAL_CAPSULE | Freq: Every day | ORAL | 0 refills | Status: DC
  Filled 2023-12-23: qty 150, 30d supply, fill #0

## 2023-11-24 ENCOUNTER — Other Ambulatory Visit: Payer: Self-pay

## 2023-11-24 ENCOUNTER — Telehealth: Payer: Self-pay

## 2023-11-24 DIAGNOSIS — M069 Rheumatoid arthritis, unspecified: Secondary | ICD-10-CM

## 2023-11-24 DIAGNOSIS — M19042 Primary osteoarthritis, left hand: Secondary | ICD-10-CM

## 2023-11-24 NOTE — Telephone Encounter (Signed)
Per pt wants referral for Rheumatology for arthritis, prefer Dr. Allena Katz  I have already ordered referral and it is pending. Pls advise

## 2023-11-24 NOTE — Telephone Encounter (Signed)
Copied from CRM [id number]. Topic: Referral - Question >> Nov 24, 2023  9:29 AM Louie Casa B wrote: Reason for CRM:  pt is wanting to get a referral to see Dr Gerrie Nordmann [facility] at

## 2023-11-24 NOTE — Telephone Encounter (Signed)
Copied from CRM [id number]. Topic: Referral - Question >> Nov 24, 2023  9:29 AM Louie Casa B wrote: Reason for CRM:  pt is wanting to get a referral to see Dr Gerrie Nordmann [facility] at  Pls advise?

## 2023-12-02 ENCOUNTER — Telehealth: Payer: Self-pay

## 2023-12-02 NOTE — Telephone Encounter (Signed)
Copied from CRM [id number]. Topic: Clinical - Medical Advice >> Dec 02, 2023  1:42 PM Louie Casa B wrote: Reason for CRM: pt has been taking prescribed rx FLUoxetine (PROZAC) 10 MG capsule and has her itching excessively and she can not sleep she wants to know if she can be prescribed something else please call her [phone]

## 2023-12-05 ENCOUNTER — Other Ambulatory Visit: Payer: Self-pay

## 2023-12-06 ENCOUNTER — Other Ambulatory Visit: Payer: Self-pay

## 2023-12-06 MED ORDER — AMOXICILLIN 500 MG PO CAPS: 2000.0000 mg | ORAL_CAPSULE | ORAL | 1 refills | Status: DC

## 2023-12-06 MED ORDER — DOXYCYCLINE HYCLATE 50 MG PO CAPS: 50.0000 mg | ORAL_CAPSULE | Freq: Every day | ORAL | 2 refills | Status: DC

## 2023-12-06 MED ORDER — HYDROXYCHLOROQUINE SULFATE 200 MG PO TABS
400.0000 mg | ORAL_TABLET | Freq: Every day | ORAL | 0 refills | Status: DC
  Filled 2024-01-20: qty 60, 30d supply, fill #0

## 2023-12-06 MED ORDER — HYDROCHLOROTHIAZIDE 25 MG PO TABS
25.0000 mg | ORAL_TABLET | Freq: Every day | ORAL | 1 refills | Status: DC
  Filled 2024-01-30: qty 90, 90d supply, fill #0
  Filled 2024-05-23: qty 90, 90d supply, fill #1

## 2023-12-06 MED ORDER — CLINDAMYCIN HCL 300 MG PO CAPS
600.0000 mg | ORAL_CAPSULE | ORAL | 0 refills | Status: DC
  Filled 2024-05-23: qty 4, 2d supply, fill #0

## 2023-12-06 MED ORDER — CLOBETASOL PROPIONATE 0.05 % EX OINT: TOPICAL_OINTMENT | CUTANEOUS | 0 refills | Status: DC

## 2023-12-06 MED ORDER — DICLOFENAC SODIUM 75 MG PO TBEC: 75.0000 mg | DELAYED_RELEASE_TABLET | Freq: Two times a day (BID) | ORAL | 2 refills | Status: DC | PRN

## 2023-12-23 ENCOUNTER — Other Ambulatory Visit: Payer: Self-pay

## 2023-12-28 ENCOUNTER — Other Ambulatory Visit: Payer: Self-pay | Admitting: Obstetrics and Gynecology

## 2023-12-28 DIAGNOSIS — Z1231 Encounter for screening mammogram for malignant neoplasm of breast: Secondary | ICD-10-CM

## 2024-01-02 DIAGNOSIS — M5451 Vertebrogenic low back pain: Secondary | ICD-10-CM | POA: Diagnosis not present

## 2024-01-02 DIAGNOSIS — M50321 Other cervical disc degeneration at C4-C5 level: Secondary | ICD-10-CM | POA: Diagnosis not present

## 2024-01-02 DIAGNOSIS — M542 Cervicalgia: Secondary | ICD-10-CM | POA: Diagnosis not present

## 2024-01-02 DIAGNOSIS — M5136 Other intervertebral disc degeneration, lumbar region with discogenic back pain only: Secondary | ICD-10-CM | POA: Diagnosis not present

## 2024-01-02 DIAGNOSIS — M50322 Other cervical disc degeneration at C5-C6 level: Secondary | ICD-10-CM | POA: Diagnosis not present

## 2024-01-02 DIAGNOSIS — M9904 Segmental and somatic dysfunction of sacral region: Secondary | ICD-10-CM | POA: Diagnosis not present

## 2024-01-02 DIAGNOSIS — M9901 Segmental and somatic dysfunction of cervical region: Secondary | ICD-10-CM | POA: Diagnosis not present

## 2024-01-02 DIAGNOSIS — M9903 Segmental and somatic dysfunction of lumbar region: Secondary | ICD-10-CM | POA: Diagnosis not present

## 2024-01-02 DIAGNOSIS — M5137 Other intervertebral disc degeneration, lumbosacral region with discogenic back pain only: Secondary | ICD-10-CM | POA: Diagnosis not present

## 2024-01-02 DIAGNOSIS — M9905 Segmental and somatic dysfunction of pelvic region: Secondary | ICD-10-CM | POA: Diagnosis not present

## 2024-01-05 DIAGNOSIS — M9904 Segmental and somatic dysfunction of sacral region: Secondary | ICD-10-CM | POA: Diagnosis not present

## 2024-01-05 DIAGNOSIS — M50322 Other cervical disc degeneration at C5-C6 level: Secondary | ICD-10-CM | POA: Diagnosis not present

## 2024-01-05 DIAGNOSIS — M9901 Segmental and somatic dysfunction of cervical region: Secondary | ICD-10-CM | POA: Diagnosis not present

## 2024-01-05 DIAGNOSIS — M5136 Other intervertebral disc degeneration, lumbar region with discogenic back pain only: Secondary | ICD-10-CM | POA: Diagnosis not present

## 2024-01-05 DIAGNOSIS — M9903 Segmental and somatic dysfunction of lumbar region: Secondary | ICD-10-CM | POA: Diagnosis not present

## 2024-01-05 DIAGNOSIS — M9905 Segmental and somatic dysfunction of pelvic region: Secondary | ICD-10-CM | POA: Diagnosis not present

## 2024-01-05 DIAGNOSIS — M50323 Other cervical disc degeneration at C6-C7 level: Secondary | ICD-10-CM | POA: Diagnosis not present

## 2024-01-05 DIAGNOSIS — M542 Cervicalgia: Secondary | ICD-10-CM | POA: Diagnosis not present

## 2024-01-05 DIAGNOSIS — M7918 Myalgia, other site: Secondary | ICD-10-CM | POA: Diagnosis not present

## 2024-01-05 DIAGNOSIS — M5451 Vertebrogenic low back pain: Secondary | ICD-10-CM | POA: Diagnosis not present

## 2024-01-09 DIAGNOSIS — M5136 Other intervertebral disc degeneration, lumbar region with discogenic back pain only: Secondary | ICD-10-CM | POA: Diagnosis not present

## 2024-01-09 DIAGNOSIS — M50323 Other cervical disc degeneration at C6-C7 level: Secondary | ICD-10-CM | POA: Diagnosis not present

## 2024-01-09 DIAGNOSIS — M9905 Segmental and somatic dysfunction of pelvic region: Secondary | ICD-10-CM | POA: Diagnosis not present

## 2024-01-09 DIAGNOSIS — M542 Cervicalgia: Secondary | ICD-10-CM | POA: Diagnosis not present

## 2024-01-09 DIAGNOSIS — M9903 Segmental and somatic dysfunction of lumbar region: Secondary | ICD-10-CM | POA: Diagnosis not present

## 2024-01-09 DIAGNOSIS — M9901 Segmental and somatic dysfunction of cervical region: Secondary | ICD-10-CM | POA: Diagnosis not present

## 2024-01-09 DIAGNOSIS — M7918 Myalgia, other site: Secondary | ICD-10-CM | POA: Diagnosis not present

## 2024-01-09 DIAGNOSIS — M5451 Vertebrogenic low back pain: Secondary | ICD-10-CM | POA: Diagnosis not present

## 2024-01-09 DIAGNOSIS — M9904 Segmental and somatic dysfunction of sacral region: Secondary | ICD-10-CM | POA: Diagnosis not present

## 2024-01-09 DIAGNOSIS — M50322 Other cervical disc degeneration at C5-C6 level: Secondary | ICD-10-CM | POA: Diagnosis not present

## 2024-01-11 DIAGNOSIS — M50322 Other cervical disc degeneration at C5-C6 level: Secondary | ICD-10-CM | POA: Diagnosis not present

## 2024-01-11 DIAGNOSIS — M5451 Vertebrogenic low back pain: Secondary | ICD-10-CM | POA: Diagnosis not present

## 2024-01-11 DIAGNOSIS — M542 Cervicalgia: Secondary | ICD-10-CM | POA: Diagnosis not present

## 2024-01-11 DIAGNOSIS — M9905 Segmental and somatic dysfunction of pelvic region: Secondary | ICD-10-CM | POA: Diagnosis not present

## 2024-01-11 DIAGNOSIS — M9903 Segmental and somatic dysfunction of lumbar region: Secondary | ICD-10-CM | POA: Diagnosis not present

## 2024-01-11 DIAGNOSIS — M9904 Segmental and somatic dysfunction of sacral region: Secondary | ICD-10-CM | POA: Diagnosis not present

## 2024-01-11 DIAGNOSIS — M50323 Other cervical disc degeneration at C6-C7 level: Secondary | ICD-10-CM | POA: Diagnosis not present

## 2024-01-11 DIAGNOSIS — M7918 Myalgia, other site: Secondary | ICD-10-CM | POA: Diagnosis not present

## 2024-01-11 DIAGNOSIS — M9901 Segmental and somatic dysfunction of cervical region: Secondary | ICD-10-CM | POA: Diagnosis not present

## 2024-01-11 DIAGNOSIS — M5136 Other intervertebral disc degeneration, lumbar region with discogenic back pain only: Secondary | ICD-10-CM | POA: Diagnosis not present

## 2024-01-13 DIAGNOSIS — M5451 Vertebrogenic low back pain: Secondary | ICD-10-CM | POA: Diagnosis not present

## 2024-01-13 DIAGNOSIS — M9904 Segmental and somatic dysfunction of sacral region: Secondary | ICD-10-CM | POA: Diagnosis not present

## 2024-01-13 DIAGNOSIS — M542 Cervicalgia: Secondary | ICD-10-CM | POA: Diagnosis not present

## 2024-01-13 DIAGNOSIS — M9901 Segmental and somatic dysfunction of cervical region: Secondary | ICD-10-CM | POA: Diagnosis not present

## 2024-01-13 DIAGNOSIS — M50323 Other cervical disc degeneration at C6-C7 level: Secondary | ICD-10-CM | POA: Diagnosis not present

## 2024-01-13 DIAGNOSIS — M5136 Other intervertebral disc degeneration, lumbar region with discogenic back pain only: Secondary | ICD-10-CM | POA: Diagnosis not present

## 2024-01-13 DIAGNOSIS — M7918 Myalgia, other site: Secondary | ICD-10-CM | POA: Diagnosis not present

## 2024-01-13 DIAGNOSIS — M9905 Segmental and somatic dysfunction of pelvic region: Secondary | ICD-10-CM | POA: Diagnosis not present

## 2024-01-13 DIAGNOSIS — M50322 Other cervical disc degeneration at C5-C6 level: Secondary | ICD-10-CM | POA: Diagnosis not present

## 2024-01-13 DIAGNOSIS — M9903 Segmental and somatic dysfunction of lumbar region: Secondary | ICD-10-CM | POA: Diagnosis not present

## 2024-01-17 DIAGNOSIS — Z7960 Long term (current) use of unspecified immunomodulators and immunosuppressants: Secondary | ICD-10-CM | POA: Diagnosis not present

## 2024-01-17 DIAGNOSIS — Z111 Encounter for screening for respiratory tuberculosis: Secondary | ICD-10-CM | POA: Diagnosis not present

## 2024-01-17 DIAGNOSIS — M0609 Rheumatoid arthritis without rheumatoid factor, multiple sites: Secondary | ICD-10-CM | POA: Diagnosis not present

## 2024-01-17 DIAGNOSIS — M150 Primary generalized (osteo)arthritis: Secondary | ICD-10-CM | POA: Diagnosis not present

## 2024-01-18 DIAGNOSIS — M50323 Other cervical disc degeneration at C6-C7 level: Secondary | ICD-10-CM | POA: Diagnosis not present

## 2024-01-18 DIAGNOSIS — M9903 Segmental and somatic dysfunction of lumbar region: Secondary | ICD-10-CM | POA: Diagnosis not present

## 2024-01-18 DIAGNOSIS — M5451 Vertebrogenic low back pain: Secondary | ICD-10-CM | POA: Diagnosis not present

## 2024-01-18 DIAGNOSIS — M9905 Segmental and somatic dysfunction of pelvic region: Secondary | ICD-10-CM | POA: Diagnosis not present

## 2024-01-18 DIAGNOSIS — M9901 Segmental and somatic dysfunction of cervical region: Secondary | ICD-10-CM | POA: Diagnosis not present

## 2024-01-18 DIAGNOSIS — M7918 Myalgia, other site: Secondary | ICD-10-CM | POA: Diagnosis not present

## 2024-01-18 DIAGNOSIS — M50322 Other cervical disc degeneration at C5-C6 level: Secondary | ICD-10-CM | POA: Diagnosis not present

## 2024-01-18 DIAGNOSIS — M9904 Segmental and somatic dysfunction of sacral region: Secondary | ICD-10-CM | POA: Diagnosis not present

## 2024-01-18 DIAGNOSIS — M542 Cervicalgia: Secondary | ICD-10-CM | POA: Diagnosis not present

## 2024-01-18 DIAGNOSIS — M5136 Other intervertebral disc degeneration, lumbar region with discogenic back pain only: Secondary | ICD-10-CM | POA: Diagnosis not present

## 2024-01-20 ENCOUNTER — Other Ambulatory Visit: Payer: Self-pay

## 2024-01-20 DIAGNOSIS — M50322 Other cervical disc degeneration at C5-C6 level: Secondary | ICD-10-CM | POA: Diagnosis not present

## 2024-01-20 DIAGNOSIS — M542 Cervicalgia: Secondary | ICD-10-CM | POA: Diagnosis not present

## 2024-01-20 DIAGNOSIS — M9904 Segmental and somatic dysfunction of sacral region: Secondary | ICD-10-CM | POA: Diagnosis not present

## 2024-01-20 DIAGNOSIS — M9903 Segmental and somatic dysfunction of lumbar region: Secondary | ICD-10-CM | POA: Diagnosis not present

## 2024-01-20 DIAGNOSIS — M5451 Vertebrogenic low back pain: Secondary | ICD-10-CM | POA: Diagnosis not present

## 2024-01-20 DIAGNOSIS — M9905 Segmental and somatic dysfunction of pelvic region: Secondary | ICD-10-CM | POA: Diagnosis not present

## 2024-01-20 DIAGNOSIS — M50323 Other cervical disc degeneration at C6-C7 level: Secondary | ICD-10-CM | POA: Diagnosis not present

## 2024-01-20 DIAGNOSIS — M5136 Other intervertebral disc degeneration, lumbar region with discogenic back pain only: Secondary | ICD-10-CM | POA: Diagnosis not present

## 2024-01-20 DIAGNOSIS — M9901 Segmental and somatic dysfunction of cervical region: Secondary | ICD-10-CM | POA: Diagnosis not present

## 2024-01-20 DIAGNOSIS — M7918 Myalgia, other site: Secondary | ICD-10-CM | POA: Diagnosis not present

## 2024-01-23 DIAGNOSIS — M5136 Other intervertebral disc degeneration, lumbar region with discogenic back pain only: Secondary | ICD-10-CM | POA: Diagnosis not present

## 2024-01-23 DIAGNOSIS — M9904 Segmental and somatic dysfunction of sacral region: Secondary | ICD-10-CM | POA: Diagnosis not present

## 2024-01-23 DIAGNOSIS — M9903 Segmental and somatic dysfunction of lumbar region: Secondary | ICD-10-CM | POA: Diagnosis not present

## 2024-01-23 DIAGNOSIS — M542 Cervicalgia: Secondary | ICD-10-CM | POA: Diagnosis not present

## 2024-01-23 DIAGNOSIS — M50323 Other cervical disc degeneration at C6-C7 level: Secondary | ICD-10-CM | POA: Diagnosis not present

## 2024-01-23 DIAGNOSIS — M50322 Other cervical disc degeneration at C5-C6 level: Secondary | ICD-10-CM | POA: Diagnosis not present

## 2024-01-23 DIAGNOSIS — M7918 Myalgia, other site: Secondary | ICD-10-CM | POA: Diagnosis not present

## 2024-01-23 DIAGNOSIS — M9931 Osseous stenosis of neural canal of cervical region: Secondary | ICD-10-CM | POA: Diagnosis not present

## 2024-01-23 DIAGNOSIS — M9905 Segmental and somatic dysfunction of pelvic region: Secondary | ICD-10-CM | POA: Diagnosis not present

## 2024-01-23 DIAGNOSIS — M5451 Vertebrogenic low back pain: Secondary | ICD-10-CM | POA: Diagnosis not present

## 2024-01-25 DIAGNOSIS — M5136 Other intervertebral disc degeneration, lumbar region with discogenic back pain only: Secondary | ICD-10-CM | POA: Diagnosis not present

## 2024-01-25 DIAGNOSIS — M9901 Segmental and somatic dysfunction of cervical region: Secondary | ICD-10-CM | POA: Diagnosis not present

## 2024-01-25 DIAGNOSIS — M5451 Vertebrogenic low back pain: Secondary | ICD-10-CM | POA: Diagnosis not present

## 2024-01-25 DIAGNOSIS — M50323 Other cervical disc degeneration at C6-C7 level: Secondary | ICD-10-CM | POA: Diagnosis not present

## 2024-01-25 DIAGNOSIS — M542 Cervicalgia: Secondary | ICD-10-CM | POA: Diagnosis not present

## 2024-01-25 DIAGNOSIS — M50322 Other cervical disc degeneration at C5-C6 level: Secondary | ICD-10-CM | POA: Diagnosis not present

## 2024-01-25 DIAGNOSIS — M9904 Segmental and somatic dysfunction of sacral region: Secondary | ICD-10-CM | POA: Diagnosis not present

## 2024-01-25 DIAGNOSIS — M9903 Segmental and somatic dysfunction of lumbar region: Secondary | ICD-10-CM | POA: Diagnosis not present

## 2024-01-25 DIAGNOSIS — M7918 Myalgia, other site: Secondary | ICD-10-CM | POA: Diagnosis not present

## 2024-01-25 DIAGNOSIS — M9905 Segmental and somatic dysfunction of pelvic region: Secondary | ICD-10-CM | POA: Diagnosis not present

## 2024-01-26 ENCOUNTER — Other Ambulatory Visit: Payer: Self-pay | Admitting: Family Medicine

## 2024-01-26 ENCOUNTER — Other Ambulatory Visit: Payer: Self-pay

## 2024-01-27 ENCOUNTER — Other Ambulatory Visit: Payer: Self-pay

## 2024-01-27 DIAGNOSIS — M542 Cervicalgia: Secondary | ICD-10-CM | POA: Diagnosis not present

## 2024-01-27 DIAGNOSIS — M5451 Vertebrogenic low back pain: Secondary | ICD-10-CM | POA: Diagnosis not present

## 2024-01-27 DIAGNOSIS — M7918 Myalgia, other site: Secondary | ICD-10-CM | POA: Diagnosis not present

## 2024-01-27 DIAGNOSIS — M50323 Other cervical disc degeneration at C6-C7 level: Secondary | ICD-10-CM | POA: Diagnosis not present

## 2024-01-27 DIAGNOSIS — M9931 Osseous stenosis of neural canal of cervical region: Secondary | ICD-10-CM | POA: Diagnosis not present

## 2024-01-27 DIAGNOSIS — M9904 Segmental and somatic dysfunction of sacral region: Secondary | ICD-10-CM | POA: Diagnosis not present

## 2024-01-27 DIAGNOSIS — M5136 Other intervertebral disc degeneration, lumbar region with discogenic back pain only: Secondary | ICD-10-CM | POA: Diagnosis not present

## 2024-01-27 DIAGNOSIS — M9905 Segmental and somatic dysfunction of pelvic region: Secondary | ICD-10-CM | POA: Diagnosis not present

## 2024-01-27 DIAGNOSIS — M50322 Other cervical disc degeneration at C5-C6 level: Secondary | ICD-10-CM | POA: Diagnosis not present

## 2024-01-27 DIAGNOSIS — M9903 Segmental and somatic dysfunction of lumbar region: Secondary | ICD-10-CM | POA: Diagnosis not present

## 2024-01-30 ENCOUNTER — Other Ambulatory Visit: Payer: Commercial Managed Care - PPO

## 2024-01-30 ENCOUNTER — Other Ambulatory Visit: Payer: Self-pay

## 2024-01-30 DIAGNOSIS — M9903 Segmental and somatic dysfunction of lumbar region: Secondary | ICD-10-CM | POA: Diagnosis not present

## 2024-01-30 DIAGNOSIS — M50323 Other cervical disc degeneration at C6-C7 level: Secondary | ICD-10-CM | POA: Diagnosis not present

## 2024-01-30 DIAGNOSIS — R7303 Prediabetes: Secondary | ICD-10-CM

## 2024-01-30 DIAGNOSIS — I10 Essential (primary) hypertension: Secondary | ICD-10-CM

## 2024-01-30 DIAGNOSIS — E785 Hyperlipidemia, unspecified: Secondary | ICD-10-CM | POA: Diagnosis not present

## 2024-01-30 DIAGNOSIS — M5451 Vertebrogenic low back pain: Secondary | ICD-10-CM | POA: Diagnosis not present

## 2024-01-30 DIAGNOSIS — M9901 Segmental and somatic dysfunction of cervical region: Secondary | ICD-10-CM | POA: Diagnosis not present

## 2024-01-30 DIAGNOSIS — M9905 Segmental and somatic dysfunction of pelvic region: Secondary | ICD-10-CM | POA: Diagnosis not present

## 2024-01-30 DIAGNOSIS — M542 Cervicalgia: Secondary | ICD-10-CM | POA: Diagnosis not present

## 2024-01-30 DIAGNOSIS — M7918 Myalgia, other site: Secondary | ICD-10-CM | POA: Diagnosis not present

## 2024-01-30 DIAGNOSIS — M9904 Segmental and somatic dysfunction of sacral region: Secondary | ICD-10-CM | POA: Diagnosis not present

## 2024-01-30 DIAGNOSIS — M50322 Other cervical disc degeneration at C5-C6 level: Secondary | ICD-10-CM | POA: Diagnosis not present

## 2024-01-30 DIAGNOSIS — M5136 Other intervertebral disc degeneration, lumbar region with discogenic back pain only: Secondary | ICD-10-CM | POA: Diagnosis not present

## 2024-01-30 MED FILL — Gabapentin Cap 100 MG: ORAL | 30 days supply | Qty: 150 | Fill #0 | Status: AC

## 2024-01-31 ENCOUNTER — Ambulatory Visit
Admission: RE | Admit: 2024-01-31 | Discharge: 2024-01-31 | Disposition: A | Discharge status: Home / Self Care | Payer: Commercial Managed Care - PPO | Source: Ambulatory Visit | Attending: Obstetrics and Gynecology | Admitting: Obstetrics and Gynecology

## 2024-01-31 DIAGNOSIS — Z1231 Encounter for screening mammogram for malignant neoplasm of breast: Secondary | ICD-10-CM | POA: Insufficient documentation

## 2024-01-31 LAB — LIPID PANEL
Cholesterol: 207 mg/dL — ABNORMAL HIGH (ref <200)
HDL: 79 mg/dL (ref >=50)
LDL Cholesterol (Calc): 109 mg/dL — ABNORMAL HIGH
Non-HDL Cholesterol (Calc): 128 mg/dL (ref <130)
Total CHOL/HDL Ratio: 2.6 (calc) (ref <5.0)
Triglycerides: 93 mg/dL (ref <150)

## 2024-01-31 LAB — COMPLETE METABOLIC PANEL WITH GFR
AG Ratio: 1.4 (calc) (ref 1.0–2.5)
ALT: 18 U/L (ref 6–29)
AST: 17 U/L (ref 10–35)
Albumin: 4.2 g/dL (ref 3.6–5.1)
Alkaline phosphatase (APISO): 66 U/L (ref 37–153)
BUN: 16 mg/dL (ref 7–25)
CO2: 29 mmol/L (ref 20–32)
Calcium: 9.4 mg/dL (ref 8.6–10.4)
Chloride: 98 mmol/L (ref 98–110)
Creat: 0.85 mg/dL (ref 0.50–1.05)
Globulin: 3 g/dL (ref 1.9–3.7)
Glucose, Bld: 90 mg/dL (ref 65–99)
Potassium: 3.9 mmol/L (ref 3.5–5.3)
Sodium: 136 mmol/L (ref 135–146)
Total Bilirubin: 0.4 mg/dL (ref 0.2–1.2)
Total Protein: 7.2 g/dL (ref 6.1–8.1)
eGFR: 77 mL/min/{1.73_m2} (ref >=60)

## 2024-01-31 LAB — CBC WITH DIFFERENTIAL/PLATELET
Absolute Lymphocytes: 1841 {cells}/uL (ref 850–3900)
Absolute Monocytes: 640 {cells}/uL (ref 200–950)
Basophils Absolute: 31 {cells}/uL (ref 0–200)
Basophils Relative: 0.6 %
Eosinophils Absolute: 239 {cells}/uL (ref 15–500)
Eosinophils Relative: 4.6 %
HCT: 41.8 % (ref 35.0–45.0)
Hemoglobin: 14 g/dL (ref 11.7–15.5)
MCH: 31.3 pg (ref 27.0–33.0)
MCHC: 33.5 g/dL (ref 32.0–36.0)
MCV: 93.3 fL (ref 80.0–100.0)
MPV: 10.4 fL (ref 7.5–12.5)
Monocytes Relative: 12.3 %
Neutro Abs: 2449 {cells}/uL (ref 1500–7800)
Neutrophils Relative %: 47.1 %
Platelets: 235 10*3/uL (ref 140–400)
RBC: 4.48 10*6/uL (ref 3.80–5.10)
RDW: 12.7 % (ref 11.0–15.0)
Total Lymphocyte: 35.4 %
WBC: 5.2 10*3/uL (ref 3.8–10.8)

## 2024-01-31 LAB — HEMOGLOBIN A1C
Hgb A1c MFr Bld: 5.9 %{Hb} — ABNORMAL HIGH (ref <5.7)
Mean Plasma Glucose: 123 mg/dL
eAG (mmol/L): 6.8 mmol/L

## 2024-02-01 ENCOUNTER — Encounter: Payer: Self-pay | Admitting: Family Medicine

## 2024-02-01 ENCOUNTER — Other Ambulatory Visit: Payer: Self-pay

## 2024-02-01 ENCOUNTER — Ambulatory Visit (INDEPENDENT_AMBULATORY_CARE_PROVIDER_SITE_OTHER): Payer: Commercial Managed Care - PPO | Admitting: Family Medicine

## 2024-02-01 VITALS — BP 120/70 | HR 69 | Temp 98.2°F | Ht 63.0 in | Wt 198.0 lb

## 2024-02-01 DIAGNOSIS — L299 Pruritus, unspecified: Secondary | ICD-10-CM

## 2024-02-01 DIAGNOSIS — T7840XS Allergy, unspecified, sequela: Secondary | ICD-10-CM | POA: Diagnosis not present

## 2024-02-01 DIAGNOSIS — F411 Generalized anxiety disorder: Secondary | ICD-10-CM

## 2024-02-01 DIAGNOSIS — E782 Mixed hyperlipidemia: Secondary | ICD-10-CM | POA: Diagnosis not present

## 2024-02-01 DIAGNOSIS — T7840XA Allergy, unspecified, initial encounter: Secondary | ICD-10-CM | POA: Insufficient documentation

## 2024-02-01 DIAGNOSIS — I10 Essential (primary) hypertension: Secondary | ICD-10-CM

## 2024-02-01 MED ORDER — ESCITALOPRAM OXALATE 10 MG PO TABS
10.0000 mg | ORAL_TABLET | Freq: Every day | ORAL | 1 refills | Status: DC
  Filled 2024-02-01: qty 90, 90d supply, fill #0
  Filled 2024-05-02: qty 90, 90d supply, fill #1

## 2024-02-01 NOTE — Progress Notes (Signed)
 Subjective:  HPI: Cheryl Shepherd is a 63 y.o. female presenting on 02/01/2024 for Follow-up (3 mos f/u labs)   HPI Patient is in today for blood pressure, HLD, and anxiety follow up.   HYPERTENSION / HYPERLIPIDEMIA Satisfied with current treatment? yes Duration of hypertension: chronic BP monitoring frequency: not checking BP range:  BP medication side effects: no Past BP meds: hydrochlorothiazide Duration of hyperlipidemia: chronic Cholesterol medication side effects: n/a Cholesterol supplements: none Past cholesterol medications: none Medication compliance: n/a Aspirin: no Recent stressors: no Recurrent headaches: no Visual changes: no Palpitations: no Dyspnea: no Chest pain: no Lower extremity edema: no Dizzy/lightheaded: no     02/01/2024   11:44 AM 11/01/2023   11:20 AM 10/05/2023   12:28 PM  GAD 7 : Generalized Anxiety Score  Nervous, Anxious, on Edge 1 0 1  Control/stop worrying 1 0 1  Worry too much - different things 1 0 1  Trouble relaxing 1 0 1  Restless 1 0 1  Easily annoyed or irritable 0 0 0  Afraid - awful might happen 1 0 1  Total GAD 7 Score 6 0 6  Anxiety Difficulty Somewhat difficult Not difficult at all Not difficult at all        Review of Systems  All other systems reviewed and are negative.   Relevant past medical history reviewed and updated as indicated.   Past Medical History:  Diagnosis Date   Anxiety    Cancer (HCC)    skin   Hyperlipidemia    Restless leg syndrome    Rheumatoid arteritis (HCC)      Past Surgical History:  Procedure Laterality Date   ABLATION     CARPECTOMY     right wrist   CHOLECYSTECTOMY     COLONOSCOPY WITH PROPOFOL N/A 02/22/2020   Procedure: COLONOSCOPY WITH PROPOFOL;  Surgeon: Toney Reil, MD;  Location: [facility];  Service: Gastroenterology;  Laterality: N/A;   KNEE SURGERY  06/2021   total replacement on L-knee   REPLACEMENT TOTAL KNEE Left     Allergies and medications  reviewed and updated.   Current Outpatient Medications:    Ascorbic Acid (VITAMIN C) 1000 MG tablet, Take 1,000 mg by mouth daily., Disp: , Rfl:    Cholecalciferol (VITAMIN D3) 125 MCG (5000 UT) CAPS, Take 1 capsule by mouth daily., Disp: , Rfl:    clobetasol ointment (TEMOVATE) 0.05 %, Apply thin film to affected rash on legs 2 times daily until clear for up to 2 weeks, then as needed for flares., Disp: 45 g, Rfl: 0   cyanocobalamin (VITAMIN B12) 1000 MCG tablet, Take 1,000 mcg by mouth in the morning and at bedtime., Disp: , Rfl:    diclofenac (VOLTAREN) 75 MG EC tablet, Take 1 tablet (75 mg total) by mouth 2 (two) times daily as needed., Disp: 60 tablet, Rfl: 2   gabapentin (NEURONTIN) 100 MG capsule, Take 5 capsules (500 mg total) by mouth at bedtime., Disp: 150 capsule, Rfl: 0   golimumab (SIMPONI ARIA) 50 MG/4ML SOLN injection, 2mg /kg Intravenous every 8 weeks, Disp: , Rfl:    hydrochlorothiazide (HYDRODIURIL) 25 MG tablet, Take 1 tablet (25 mg total) by mouth daily., Disp: 90 tablet, Rfl: 1   Hydroxychloroquine Sulfate 100 MG TABS, Take 2 tablets by mouth daily., Disp: , Rfl:    methocarbamol (ROBAXIN) 500 MG tablet, Take 500 mg by mouth as needed., Disp: , Rfl:    zinc gluconate 50 MG tablet, Take 50 mg by mouth  daily., Disp: , Rfl:    amoxicillin (AMOXIL) 500 MG capsule, Take 4 capsules (2,000 mg total) by mouth 1 hour before procedure. (Patient not taking: Reported on 02/01/2024), Disp: 4 capsule, Rfl: 1   clindamycin (CLEOCIN) 300 MG capsule, Take 2 capsules (600 mg total) by mouth 1 hour before dental appointment. (Patient not taking: Reported on 02/01/2024), Disp: 4 capsule, Rfl: 0  Current Facility-Administered Medications:    betamethasone acetate-betamethasone sodium phosphate (CELESTONE) injection 3 mg, 3 mg, Intramuscular, Once, Evans, Larena Glassman, DPM  Allergies  Allergen Reactions   Cortisone Swelling, Hives and Other (See Comments)    Flushing   Penicillin G Other (See  Comments)   Sulfa Antibiotics Nausea Only and Other (See Comments)   Sulfacetamide     Other reaction(s): NAUSEA   Penicillins Rash and Other (See Comments)    Objective:   BP 120/70   Pulse 69   Temp 98.2 F (36.8 C) (Oral)   Ht 5\' 3"  (1.6 m)   Wt 198 lb (89.8 kg)   LMP  (Approximate)   SpO2 98%   BMI 35.07 kg/m      02/01/2024   11:17 AM 11/01/2023   11:18 AM 10/05/2023   10:44 AM  Vitals with BMI  Height 5\' 3"  5\' 3"  5\' 3"   Weight 198 lbs 203 lbs 4 oz 207 lbs  BMI 35.08 36.01 36.68  Systolic 120 124 098  Diastolic 70 78 72  Pulse 69 66 70     Physical Exam Vitals and nursing note reviewed.  Constitutional:      Appearance: Normal appearance. She is normal weight.  HENT:     Head: Normocephalic and atraumatic.  Cardiovascular:     Rate and Rhythm: Normal rate and regular rhythm.     Pulses: Normal pulses.     Heart sounds: Normal heart sounds.  Pulmonary:     Effort: Pulmonary effort is normal.     Breath sounds: Normal breath sounds.  Skin:    General: Skin is warm and dry.  Neurological:     General: No focal deficit present.     Mental Status: She is alert and oriented to person, place, and time. Mental status is at baseline.  Psychiatric:        Mood and Affect: Mood normal.        Behavior: Behavior normal.        Thought Content: Thought content normal.        Judgment: Judgment normal.     Assessment & Plan:  Primary hypertension Assessment & Plan: Chronic, well controlled. Continue hydrochlorothiazide 25mg  daily. Fasting labs ordered. Recommend heart healthy diet such as Mediterranean diet with whole grains, fruits, vegetable, fish, lean meats, nuts, and olive oil. Limit salt. Encouraged moderate walking, 3-5 times/week for 30-50 minutes each session. Aim for at least 150 minutes.week. Goal should be pace of 3 miles/hours, or walking 1.5 miles in 30 minutes. Avoid tobacco products. Avoid excess alcohol. Take medications as prescribed and bring  medications and blood pressure log with cuff to each office visit. Seek medical care for chest pain, palpitations, shortness of breath with exertion, dizziness/lightheadedness, vision changes, recurrent headaches, or swelling of extremities.    Allergy, sequela Assessment & Plan: Endorses itching and allergies for many months to years and would like allergy testing. Referral placed.   Itching  Mixed hyperlipidemia Assessment & Plan: Labs showed elevated cholesterol. I recommend consuming a heart healthy diet such as Mediterranean diet or DASH diet with whole  grains, fruits, vegetable, fish, lean meats, nuts, and olive oil. Limit sweets and processed foods. I also encourage moderate intensity exercise 150 minutes weekly. This is 3-5 times weekly for 30-50 minutes each session. Goal should be pace of 3 miles/hours, or walking 1.5 miles in 30 minutes. 10-year calculated ASCVD risk 6% (she is not a diabetic but has prediabetes) Pt  would not like to start Statin, discussed risks vs benefits. I am very proud of her for her lifestyle modifications and hard work on diet and exercise.    Generalized anxiety disorder Assessment & Plan: Itching reaction to Prozac, would like to switch to Lexapro. Start Lexapro 10mg  daily. Denies SI/HI. Follow up in office in 4 weeks or sooner if needed.       Follow up plan: Return in about 4 weeks (around 02/29/2024) for anxiety/depression.  Park Meo, FNP

## 2024-02-01 NOTE — Assessment & Plan Note (Signed)
 Labs showed elevated cholesterol. I recommend consuming a heart healthy diet such as Mediterranean diet or DASH diet with whole grains, fruits, vegetable, fish, lean meats, nuts, and olive oil. Limit sweets and processed foods. I also encourage moderate intensity exercise 150 minutes weekly. This is 3-5 times weekly for 30-50 minutes each session. Goal should be pace of 3 miles/hours, or walking 1.5 miles in 30 minutes. 10-year calculated ASCVD risk 6% (she is not a diabetic but has prediabetes) Pt  would not like to start Statin, discussed risks vs benefits. I am very proud of her for her lifestyle modifications and hard work on diet and exercise.

## 2024-02-01 NOTE — Assessment & Plan Note (Signed)
 Chronic, well controlled. Continue hydrochlorothiazide 25mg  daily. Fasting labs ordered. Recommend heart healthy diet such as Mediterranean diet with whole grains, fruits, vegetable, fish, lean meats, nuts, and olive oil. Limit salt. Encouraged moderate walking, 3-5 times/week for 30-50 minutes each session. Aim for at least 150 minutes.week. Goal should be pace of 3 miles/hours, or walking 1.5 miles in 30 minutes. Avoid tobacco products. Avoid excess alcohol. Take medications as prescribed and bring medications and blood pressure log with cuff to each office visit. Seek medical care for chest pain, palpitations, shortness of breath with exertion, dizziness/lightheadedness, vision changes, recurrent headaches, or swelling of extremities.

## 2024-02-01 NOTE — Assessment & Plan Note (Signed)
 Endorses itching and allergies for many months to years and would like allergy testing. Referral placed.

## 2024-02-01 NOTE — Assessment & Plan Note (Signed)
 Itching reaction to Prozac, would like to switch to Lexapro. Start Lexapro 10mg  daily. Denies SI/HI. Follow up in office in 4 weeks or sooner if needed.

## 2024-02-02 ENCOUNTER — Encounter: Payer: Self-pay | Admitting: Obstetrics and Gynecology

## 2024-02-03 DIAGNOSIS — M9901 Segmental and somatic dysfunction of cervical region: Secondary | ICD-10-CM | POA: Diagnosis not present

## 2024-02-03 DIAGNOSIS — M5451 Vertebrogenic low back pain: Secondary | ICD-10-CM | POA: Diagnosis not present

## 2024-02-03 DIAGNOSIS — M7918 Myalgia, other site: Secondary | ICD-10-CM | POA: Diagnosis not present

## 2024-02-03 DIAGNOSIS — M9905 Segmental and somatic dysfunction of pelvic region: Secondary | ICD-10-CM | POA: Diagnosis not present

## 2024-02-03 DIAGNOSIS — M50323 Other cervical disc degeneration at C6-C7 level: Secondary | ICD-10-CM | POA: Diagnosis not present

## 2024-02-03 DIAGNOSIS — M5136 Other intervertebral disc degeneration, lumbar region with discogenic back pain only: Secondary | ICD-10-CM | POA: Diagnosis not present

## 2024-02-03 DIAGNOSIS — M9904 Segmental and somatic dysfunction of sacral region: Secondary | ICD-10-CM | POA: Diagnosis not present

## 2024-02-03 DIAGNOSIS — M50322 Other cervical disc degeneration at C5-C6 level: Secondary | ICD-10-CM | POA: Diagnosis not present

## 2024-02-03 DIAGNOSIS — M9903 Segmental and somatic dysfunction of lumbar region: Secondary | ICD-10-CM | POA: Diagnosis not present

## 2024-02-03 DIAGNOSIS — M542 Cervicalgia: Secondary | ICD-10-CM | POA: Diagnosis not present

## 2024-02-06 ENCOUNTER — Ambulatory Visit: Payer: Self-pay | Admitting: Internal Medicine

## 2024-02-06 ENCOUNTER — Encounter: Payer: Self-pay | Admitting: Internal Medicine

## 2024-02-06 VITALS — BP 126/70 | HR 81 | Temp 98.0°F | Resp 14 | Ht 62.21 in | Wt 200.1 lb

## 2024-02-06 DIAGNOSIS — T781XXA Other adverse food reactions, not elsewhere classified, initial encounter: Secondary | ICD-10-CM | POA: Diagnosis not present

## 2024-02-06 DIAGNOSIS — L299 Pruritus, unspecified: Secondary | ICD-10-CM

## 2024-02-06 NOTE — Progress Notes (Signed)
 NEW PATIENT  Date of Service/Encounter:  02/06/24  Consult requested by: Park Meo, FNP   Subjective:   Cheryl Shepherd (DOB: [birth year]) is a 63 y.o. female who presents to the clinic on 02/06/2024 with a chief complaint of Pruritus (Started back in Nov 2024. ), Allergy Testing (Gluten- stomach swelling ), and Other (Had a knee replacement almost 3 years ago- interested in testing for metals. ) .    History obtained from: chart review and patient.   Itching: Started in November with generalized itching.  Worse at nighttime and with showering. No rashes. Seen by Dermatology twice for unspecified dermatitis and Rx topical steroids like clobetasol PRN which does not seem to help.  Worried about food allergies as she sometimes notes the itching with eating. When she eats bread, she also gets bloating. Notes hx of total knee replacement with metal about 3 years ago; denies having trouble with rashes around site of replacement; the surgical site has healed well also. Did not have itching after the TKR.  Tried Hemp lotion, body oils, OTC lotions without improvement.  Started Prozac around that time but has since stopped it to see if it improves and it has not.   Denies symptoms of allergic rhinitis.    Reviewed:  02/01/2024: seen by Dimas Aguas NP for itching and allergies, requested referral to Allergist for testing.  01/17/2024: seen by Dr Allena Katz [facility]ology for joint pain with prior dx of RA in 2020.  Negative serology.  On Simponi and Plquenil. Reconsidering RA diagnosis.   10/13/2023: seen by Barrett Henle PA for actinic keratosis and unspecified dermatitis.  Rx clobetasol ointment PRN for flare ups.  Past Medical History: Past Medical History:  Diagnosis Date   Anxiety    Cancer (HCC)    skin   Eczema    Hyperlipidemia    Restless leg syndrome    Rheumatoid arteritis (HCC)    Past Surgical History: Past Surgical History:  Procedure Laterality Date   ABLATION     CARPECTOMY      right wrist   CHOLECYSTECTOMY     COLONOSCOPY WITH PROPOFOL N/A 02/22/2020   Procedure: COLONOSCOPY WITH PROPOFOL;  Surgeon: Toney Reil, MD;  Location: [facility];  Service: Gastroenterology;  Laterality: N/A;   KNEE SURGERY  06/2021   total replacement on L-knee   REPLACEMENT TOTAL KNEE Left     Family History: Family History  Problem Relation Age of Onset   Breast cancer Mother 36   Allergic rhinitis Sister    Other Sister        genetic testing neg (mother of niece and nephew with cancer)   Colon cancer Brother    Colon cancer Nephew 43   Breast cancer Niece 14   Angioedema Neg Hx    Asthma Neg Hx    Eczema Neg Hx    Urticaria Neg Hx     Social History:  Flooring in bedroom: wood Pets: none Tobacco use/exposure: none Job: retired   Medication List:  Allergies as of 02/06/2024       Reactions   Cortisone Swelling, Hives, Other (See Comments)   Flushing   Penicillin G Other (See Comments)   Sulfa Antibiotics Nausea Only, Other (See Comments)   Sulfacetamide    Other reaction(s): NAUSEA   Penicillins Rash, Other (See Comments)        Medication List        Accurate as of February 06, 2024  2:18 PM. If you have any  questions, ask your nurse or doctor.          STOP taking these medications    amoxicillin 500 MG capsule Commonly known as: AMOXIL Stopped by: Birder Robson       TAKE these medications    clindamycin 300 MG capsule Commonly known as: CLEOCIN Take 2 capsules (600 mg total) by mouth 1 hour before dental appointment.   clobetasol ointment 0.05 % Commonly known as: TEMOVATE Apply thin film to affected rash on legs 2 times daily until clear for up to 2 weeks, then as needed for flares.   cyanocobalamin 1000 MCG tablet Commonly known as: VITAMIN B12 Take 1,000 mcg by mouth in the morning and at bedtime.   diclofenac 75 MG EC tablet Commonly known as: VOLTAREN Take 1 tablet (75 mg total) by mouth 2 (two) times daily as  needed.   escitalopram 10 MG tablet Commonly known as: Lexapro Take 1 tablet (10 mg total) by mouth daily.   gabapentin 100 MG capsule Commonly known as: NEURONTIN Take 5 capsules (500 mg total) by mouth at bedtime.   hydrochlorothiazide 25 MG tablet Commonly known as: HYDRODIURIL Take 1 tablet (25 mg total) by mouth daily.   Hydroxychloroquine Sulfate 100 MG Tabs Take 2 tablets by mouth daily.   methocarbamol 500 MG tablet Commonly known as: ROBAXIN Take 500 mg by mouth as needed.   Simponi Aria 50 MG/4ML Soln injection Generic drug: golimumab 2mg /kg Intravenous every 8 weeks   vitamin C 1000 MG tablet Take 1,000 mg by mouth daily.   Vitamin D3 125 MCG (5000 UT) Caps Take 1 capsule by mouth daily.   zinc gluconate 50 MG tablet Take 50 mg by mouth daily.         REVIEW OF SYSTEMS: Pertinent positives and negatives discussed in HPI.   Objective:   Physical Exam: BP 126/70   Pulse 81   Temp 98 F (36.7 C)   Resp 14   Ht 5' 2.21" (1.58 m)   Wt 200 lb 2 oz (90.8 kg)   SpO2 95%   BMI 36.36 kg/m  Body mass index is 36.36 kg/m. GEN: alert, well developed HEENT: clear conjunctiva, MMM HEART: regular rate and rhythm, no murmur LUNGS: clear to auscultation bilaterally, no coughing, unlabored respiration ABDOMEN: soft, non distended  SKIN: no rashes or lesions   Assessment:   1. Pruritus   2. Adverse food reaction, initial encounter     Plan/Recommendations:  Pruritus:  - Unclear etiology.  Discussed pruritus may not be allergen driven.  Low suspicion for contact dermatitis to metal/chemicals as she does not have rashes, just itching.  - 01/2024: normal CBC with diff, and CMP; 09/2023: negative HIV/Hep C; normal TSH, A1C 5.8% - Do a daily soaking tub bath in warm water for 10-15 minutes.  - Use a gentle, unscented cleanser at the end of the bath (such as Dove unscented bar or body wash or Aveeno sensitive body wash). Then rinse, pat half-way dry, and  apply a gentle, unscented moisturizer cream or ointment (Cerave, Cetaphil, Eucerin, Aveeno, Aquaphor, Vanicream, Vaseline)  all over while still damp. Dry skin makes the itching worse.  Moisturize at least 3-4 times a day. - Discuss Sarna cream at next visit.   Hold all anti-histamines (Xyzal, Allegra, Zyrtec, Claritin, Benadryl, Pepcid) 3 days prior to next visit.  Follow up: 9 AM on 3/10 for skin testing 1-68  Alesia Morin, MD [facility]

## 2024-02-06 NOTE — Patient Instructions (Addendum)
 Pruritus:  - Do a daily soaking tub bath in warm water for 10-15 minutes.  - Use a gentle, unscented cleanser at the end of the bath (such as Dove unscented bar or body wash or Aveeno sensitive body wash). Then rinse, pat half-way dry, and apply a gentle, unscented moisturizer cream or ointment (Cerave, Cetaphil, Eucerin, Aveeno, Aquaphor, Vanicream, Vaseline)  all over while still damp. Dry skin makes the itching worse.  Moisturize at least 3-4 times a day.  Hold all anti-histamines (Xyzal, Allegra, Zyrtec, Claritin, Benadryl, Pepcid) 3 days prior to next visit.  Follow up: 9 AM on 3/10 for skin testing 1-68

## 2024-02-07 DIAGNOSIS — M9901 Segmental and somatic dysfunction of cervical region: Secondary | ICD-10-CM | POA: Diagnosis not present

## 2024-02-07 DIAGNOSIS — M542 Cervicalgia: Secondary | ICD-10-CM | POA: Diagnosis not present

## 2024-02-07 DIAGNOSIS — M50323 Other cervical disc degeneration at C6-C7 level: Secondary | ICD-10-CM | POA: Diagnosis not present

## 2024-02-07 DIAGNOSIS — M50322 Other cervical disc degeneration at C5-C6 level: Secondary | ICD-10-CM | POA: Diagnosis not present

## 2024-02-07 DIAGNOSIS — M5136 Other intervertebral disc degeneration, lumbar region with discogenic back pain only: Secondary | ICD-10-CM | POA: Diagnosis not present

## 2024-02-07 DIAGNOSIS — M5451 Vertebrogenic low back pain: Secondary | ICD-10-CM | POA: Diagnosis not present

## 2024-02-07 DIAGNOSIS — M9904 Segmental and somatic dysfunction of sacral region: Secondary | ICD-10-CM | POA: Diagnosis not present

## 2024-02-07 DIAGNOSIS — M7918 Myalgia, other site: Secondary | ICD-10-CM | POA: Diagnosis not present

## 2024-02-07 DIAGNOSIS — M9903 Segmental and somatic dysfunction of lumbar region: Secondary | ICD-10-CM | POA: Diagnosis not present

## 2024-02-07 DIAGNOSIS — M9905 Segmental and somatic dysfunction of pelvic region: Secondary | ICD-10-CM | POA: Diagnosis not present

## 2024-02-08 NOTE — Progress Notes (Unsigned)
 PCP: Park Meo, FNP   No chief complaint on file.   HPI:      [name] is a 63 y.o. [id number] whose LMP was No LMP recorded. Patient has had an ablation., presents today for her annual examination.  Her menses are absent due to menopause, no PMB. No vasomotor sx.   Sex activity: not currently sexually active. She does not have vaginal dryness.  Last Pap: 02/02/21  Results were: no abnormalities /neg HPV DNA.  No hx of abn paps with tx  Last mammogram: 01/31/24 Results were: normal--routine follow-up in 12 months There is a FH of breast cancer in her mother and niece, genetic testing not indicated for pt. Her sister (mother of niece and nephew) had neg genetic testing. There is no FH of ovarian cancer. There is a FH of colon cancer in the pt's brother and nephew. The patient does do self-breast exams.  Colonoscopy: 3/21 at [facility];  Repeat due after 5 years due to [facility].   Tobacco use: 1/2 -1 ppd for over 20 yrs; hasn't had lung CT.  Alcohol use: none No drug use Exercise: moderately active  She does get adequate calcium and Vitamin D in her diet.  Labs with PCP. Seeing rheumatology for arthritis. S/p LT knee replacement and just found out part of it has come undone. Is upset; waiting for mgmt info.    Patient Active Problem List   Diagnosis Date Noted   Allergies 02/01/2024   Generalized anxiety disorder 10/05/2023   Morbid obesity (HCC) 10/05/2023   Pain in right foot 11/02/2022   Rheumatoid arthritis (HCC) 11/02/2022   Pain of left hand 10/20/2022   Arthritis of finger of left hand 07/16/2022   Inflammatory pain 07/16/2022   Trigger thumb of left hand 07/16/2022   Carpal tunnel syndrome of left wrist 07/13/2022   Dermatochalasis of both upper eyelids 01/15/2022   Ptosis of both eyelids 01/15/2022   Visual field defect 01/15/2022   Cough 08/24/2021   Restless leg syndrome 08/03/2021   Hyperlipidemia 08/03/2021   Preoperative clearance 02/18/2021    Prediabetes 02/10/2021   Tobacco use disorder 02/10/2021   Family history of colon cancer requiring screening colonoscopy    Encounter for screening colonoscopy    Hypertension 02/22/2017   Sleep apnea 02/22/2017   Abdominal pain, acute, right upper quadrant 10/07/2014   Acute diarrhea 10/07/2014   Incomplete emptying of bladder 09/17/2013   Retention of urine 09/17/2013   Female stress incontinence 01/29/2013   Frequency of micturition 01/29/2013   Nocturia 01/29/2013   Urge incontinence 01/29/2013   Increased frequency of urination 01/29/2013    Past Surgical History:  Procedure Laterality Date   ABLATION     CARPECTOMY     right wrist   CHOLECYSTECTOMY     COLONOSCOPY WITH PROPOFOL N/A 02/22/2020   Procedure: COLONOSCOPY WITH PROPOFOL;  Surgeon: Toney Reil, MD;  Location: [facility] ENDOSCOPY;  Service: Gastroenterology;  Laterality: N/A;   KNEE SURGERY  06/2021   total replacement on L-knee   REPLACEMENT TOTAL KNEE Left     Family History  Problem Relation Age of Onset   Breast cancer Mother 10   Allergic rhinitis Sister    Other Sister        genetic testing neg (mother of niece and nephew with cancer)   Colon cancer Brother    Colon cancer Nephew 43   Breast cancer Niece 87   Angioedema Neg Hx    Asthma  Neg Hx    Eczema Neg Hx    Urticaria Neg Hx     Social History   Socioeconomic History   Marital status: Married    Spouse name: Not on file   Number of children: Not on file   Years of education: Not on file   Highest education level: 12th grade  Occupational History   Not on file  Tobacco Use   Smoking status: Every Day    Current packs/day: 1.00    Types: Cigarettes   Smokeless tobacco: Never   Tobacco comments:    Over 15+years  Vaping Use   Vaping status: Never Used  Substance and Sexual Activity   Alcohol use: No   Drug use: No   Sexual activity: Not Currently  Other Topics Concern   Not on file  Social History Narrative   Not on  file   Social Drivers of Health   Financial Resource Strain: Low Risk  (01/17/2024)   Received from [facility]   Overall Financial Resource Strain (CARDIA)    Difficulty of Paying Living Expenses: Not hard at all  Food Insecurity: No Food Insecurity (01/17/2024)   Received from [facility]   Hunger Vital Sign    Worried About Running Out of Food in the Last Year: Never true    Ran Out of Food in the Last Year: Never true  Transportation Needs: No Transportation Needs (01/17/2024)   Received from [facility] - Transportation    In the past 12 months, has lack of transportation kept you from medical appointments or from getting medications?: No    Lack of Transportation (Non-Medical): No  Physical Activity: Insufficiently Active (10/03/2023)   Exercise Vital Sign    Days of Exercise per Week: 2 days    Minutes of Exercise per Session: 30 min  Stress: Stress Concern Present (10/03/2023)   [employer] [employer] - Occupational Stress Questionnaire    Feeling of Stress : To some extent  Social Connections: Unknown (10/03/2023)   Social Connection and Isolation Panel [NHANES]    Frequency of Communication with Friends and Family: More than three times a week    Frequency of Social Gatherings with Friends and Family: Twice a week    Attends Religious Services: Patient declined    Database administrator or Organizations: No    Attends Engineer, structural: Not on file    Marital Status: Married  Catering manager Violence: Not on file     Current Outpatient Medications:    Ascorbic Acid (VITAMIN C) 1000 MG tablet, Take 1,000 mg by mouth daily., Disp: , Rfl:    Cholecalciferol (VITAMIN D3) 125 MCG (5000 UT) CAPS, Take 1 capsule by mouth daily., Disp: , Rfl:    clindamycin (CLEOCIN) 300 MG capsule, Take 2 capsules (600 mg total) by mouth 1 hour before dental appointment., Disp: 4 capsule, Rfl: 0    clobetasol ointment (TEMOVATE) 0.05 %, Apply thin film to affected rash on legs 2 times daily until clear for up to 2 weeks, then as needed for flares., Disp: 45 g, Rfl: 0   cyanocobalamin (VITAMIN B12) 1000 MCG tablet, Take 1,000 mcg by mouth in the morning and at bedtime., Disp: , Rfl:    diclofenac (VOLTAREN) 75 MG EC tablet, Take 1 tablet (75 mg total) by mouth 2 (two) times daily as needed. (Patient not taking: Reported on 02/06/2024), Disp: 60 tablet, Rfl: 2  escitalopram (LEXAPRO) 10 MG tablet, Take 1 tablet (10 mg total) by mouth daily., Disp: 90 tablet, Rfl: 1   gabapentin (NEURONTIN) 100 MG capsule, Take 5 capsules (500 mg total) by mouth at bedtime., Disp: 150 capsule, Rfl: 0   golimumab (SIMPONI ARIA) 50 MG/4ML SOLN injection, 2mg /kg Intravenous every 8 weeks, Disp: , Rfl:    hydrochlorothiazide (HYDRODIURIL) 25 MG tablet, Take 1 tablet (25 mg total) by mouth daily., Disp: 90 tablet, Rfl: 1   Hydroxychloroquine Sulfate 100 MG TABS, Take 2 tablets by mouth daily., Disp: , Rfl:    methocarbamol (ROBAXIN) 500 MG tablet, Take 500 mg by mouth as needed. (Patient not taking: Reported on 02/06/2024), Disp: , Rfl:    zinc gluconate 50 MG tablet, Take 50 mg by mouth daily., Disp: , Rfl:   Current Facility-Administered Medications:    betamethasone acetate-betamethasone sodium phosphate (CELESTONE) injection 3 mg, 3 mg, Intramuscular, Once, Evans, Brent M, DPM     ROS:  Review of Systems  Constitutional:  Negative for fatigue, fever and unexpected weight change.  Respiratory:  Negative for cough, shortness of breath and wheezing.   Cardiovascular:  Negative for chest pain, palpitations and leg swelling.  Gastrointestinal:  Negative for blood in stool, constipation, diarrhea, nausea and vomiting.  Endocrine: Negative for cold intolerance, heat intolerance and polyuria.  Genitourinary:  Negative for dyspareunia, dysuria, flank pain, frequency, genital sores, hematuria, menstrual problem, pelvic  pain, urgency, vaginal bleeding, vaginal discharge and vaginal pain.  Musculoskeletal:  Positive for arthralgias. Negative for back pain, joint swelling and myalgias.  Skin:  Negative for rash.  Neurological:  Negative for dizziness, syncope, light-headedness, numbness and headaches.  Hematological:  Negative for adenopathy.  Psychiatric/Behavioral:  Negative for agitation, confusion, sleep disturbance and suicidal ideas. The patient is not nervous/anxious.    BREAST: No symptoms    Objective: There were no vitals taken for this visit.   Physical Exam Constitutional:      Appearance: She is well-developed.  Genitourinary:     Vulva normal.     Right Labia: No rash, tenderness or lesions.    Left Labia: No tenderness, lesions or rash.    No vaginal discharge, erythema or tenderness.      Right Adnexa: not tender and no mass present.    Left Adnexa: not tender and no mass present.    No cervical friability or polyp.     Uterus is not enlarged or tender.  Breasts:    Right: No mass, nipple discharge, skin change or tenderness.     Left: No mass, nipple discharge, skin change or tenderness.  Neck:     Thyroid: No thyromegaly.  Cardiovascular:     Rate and Rhythm: Normal rate and regular rhythm.     Heart sounds: Normal heart sounds. No murmur heard. Pulmonary:     Effort: Pulmonary effort is normal.     Breath sounds: Normal breath sounds.  Abdominal:     Palpations: Abdomen is soft.     Tenderness: There is no abdominal tenderness. There is no guarding or rebound.  Musculoskeletal:        General: Normal range of motion.     Cervical back: Normal range of motion.  Lymphadenopathy:     Cervical: No cervical adenopathy.  Neurological:     General: No focal deficit present.     Mental Status: She is alert and oriented to person, place, and time.     Cranial Nerves: No cranial nerve deficit.  Skin:  General: Skin is warm and dry.  Psychiatric:        Mood and Affect:  Mood normal.        Behavior: Behavior normal.        Thought Content: Thought content normal.        Judgment: Judgment normal.  Vitals reviewed.     Assessment/Plan:  Encounter for annual routine gynecological examination  Encounter for screening mammogram for malignant neoplasm of breast; pt current on mammo  Tobacco use disorder--offered lung CT. Pt to consider and f/u prn.          GYN counsel mammography screening, menopause, adequate intake of calcium and vitamin D, diet and exercise    F/U  No follow-ups on file.  Tanajah Boulter B. Eunie Lawn, PA-C 02/08/2024 6:11 PM

## 2024-02-09 ENCOUNTER — Other Ambulatory Visit (HOSPITAL_COMMUNITY)
Admission: RE | Admit: 2024-02-09 | Discharge: 2024-02-09 | Disposition: A | Discharge status: Home / Self Care | Source: Ambulatory Visit | Attending: Obstetrics and Gynecology | Admitting: Obstetrics and Gynecology

## 2024-02-09 ENCOUNTER — Encounter: Payer: Self-pay | Admitting: Obstetrics and Gynecology

## 2024-02-09 ENCOUNTER — Ambulatory Visit: Payer: BC Managed Care – PPO | Admitting: Obstetrics and Gynecology

## 2024-02-09 VITALS — BP 128/72 | Ht 62.0 in | Wt 199.0 lb

## 2024-02-09 DIAGNOSIS — Z1151 Encounter for screening for human papillomavirus (HPV): Secondary | ICD-10-CM | POA: Diagnosis not present

## 2024-02-09 DIAGNOSIS — Z1231 Encounter for screening mammogram for malignant neoplasm of breast: Secondary | ICD-10-CM

## 2024-02-09 DIAGNOSIS — F17200 Nicotine dependence, unspecified, uncomplicated: Secondary | ICD-10-CM

## 2024-02-09 DIAGNOSIS — Z124 Encounter for screening for malignant neoplasm of cervix: Secondary | ICD-10-CM | POA: Diagnosis not present

## 2024-02-09 DIAGNOSIS — Z01419 Encounter for gynecological examination (general) (routine) without abnormal findings: Secondary | ICD-10-CM

## 2024-02-09 NOTE — Patient Instructions (Signed)
 I value your feedback and you entrusting [place] with your care. If you get a [place] patient survey, I would appreciate you taking the time to let us know about your experience today. Thank you! ? ? ?

## 2024-02-13 ENCOUNTER — Ambulatory Visit: Admitting: Internal Medicine

## 2024-02-13 DIAGNOSIS — T781XXD Other adverse food reactions, not elsewhere classified, subsequent encounter: Secondary | ICD-10-CM

## 2024-02-13 DIAGNOSIS — L299 Pruritus, unspecified: Secondary | ICD-10-CM | POA: Diagnosis not present

## 2024-02-13 NOTE — Progress Notes (Signed)
 FOLLOW UP Date of Service/Encounter:  02/13/24   Subjective:  Cheryl Shepherd (DOB: [birth year]) is a 63 y.o. female who [facility] for follow up for skin testing.   History obtained from: chart review and patient.  Anti histamines held.   Past Medical History: Past Medical History:  Diagnosis Date   Anxiety    Cancer (HCC)    skin   Eczema    Hyperlipidemia    Restless leg syndrome    Rheumatoid arteritis (HCC)     Objective:  There were no vitals taken for this visit. There is no height or weight on file to calculate BMI. Physical Exam: GEN: alert, well developed HEENT: clear conjunctiva, MMM LUNGS: unlabored respiration  Skin Testing:  Skin prick testing was placed, which includes aeroallergens/foods, histamine control, and saline control.  Verbal consent was obtained prior to placing test.  Patient tolerated procedure well.  Allergy testing results were read and interpreted by myself, documented by clinical staff. Adequate positive and negative control.  Positive results to:  Results discussed with patient/family.  Airborne Adult Perc - 02/13/24 0859     Time Antigen Placed 0859    Allergen Manufacturer Waynette Buttery    Location Back    Number of Test 55    1. Control-Buffer 50% Glycerol Negative    2. Control-Histamine 3+    3. Bahia Negative    4. French Southern Territories Negative    5. Johnson Negative    6. Kentucky Blue Negative    7. Meadow Fescue Negative    8. Perennial Rye Negative    9. Timothy Negative    10. Ragweed Mix Negative    11. Cocklebur Negative    12. Plantain,  English Negative    13. Baccharis Negative    14. Dog Fennel Negative    15. Russian Thistle Negative    16. Lamb's Quarters Negative    17. Sheep Sorrell Negative    18. Rough Pigweed Negative    19. Marsh Elder, Rough Negative    20. Mugwort, Common Negative    21. Box, Elder Negative    22. Cedar, red Negative    23. Sweet Gum Negative    24.  Pecan Pollen Negative    25. Pine Mix Negative    26. Walnut, Black Pollen Negative    27. Red Mulberry Negative    28. Ash Mix Negative    29. Birch Mix Negative    30. Beech American Negative    31. Cottonwood, Guinea-Bissau Negative    32. Hickory, White Negative    33. Maple Mix Negative    34. Oak, Guinea-[place]issau Mix Negative    35. Sycamore Eastern Negative    36. Alternaria Alternata Negative    37. Cladosporium Herbarum Negative    38. Aspergillus Mix Negative    39. Penicillium Mix Negative    40. Bipolaris Sorokiniana (Helminthosporium) Negative    41. Drechslera Spicifera (Curvularia) Negative    42. Mucor Plumbeus Negative    43. Fusarium Moniliforme Negative    44. Aureobasidium Pullulans (pullulara) Negative    45. Rhizopus Oryzae Negative    46. Botrytis Cinera Negative    47. Epicoccum Nigrum Negative    48. Phoma Betae Negative    49. Dust Mite Mix Negative    50. Cat Hair 10,000 BAU/ml Negative    51.  Dog Epithelia Negative    52. Mixed Feathers Negative    53. Horse Epithelia Negative  54. Cockroach, German Negative    55. Tobacco Leaf Negative             13 Food Perc - 02/13/24 0859       Test Information   Time Antigen Placed 1610    Allergen Manufacturer Waynette Buttery    Location Back    Number of allergen test 13      Food   1. Peanut Negative    2. Soybean Negative    3. Wheat Negative    4. Sesame Negative    5. Milk, Cow Negative    6. Casein Negative    7. Egg White, Chicken Negative    8. Shellfish Mix Negative    9. Fish Mix Negative    10. Cashew Negative    11. Walnut Food Negative    12. Almond Negative    13. Hazelnut Negative              Assessment:   1. Pruritus   2. Adverse food reaction, subsequent encounter     Plan/Recommendations:  Pruritus:  - Unclear etiology, likely related to chronic skin changes as we age and dry skin.  Discussed pruritus is not allergen driven in setting of negative testing.  Low suspicion for  contact dermatitis to metal/chemicals as she does not have rashes, just itching.  - 01/2024: normal CBC with diff, and CMP; 09/2023: negative HIV/Hep C; normal TSH, A1C 5.8% - SPT 02/2024: negative to aeroallergens and commonly allergenic foods.  - Do a daily soaking tub bath in warm water for 10-15 minutes.  - Use a gentle, unscented cleanser at the end of the bath (such as Dove unscented bar or body wash or Aveeno sensitive body wash). Then rinse, pat half-way dry, and apply a gentle, unscented moisturizer cream or ointment (Cerave, Cetaphil, Eucerin, Aveeno, Aquaphor, Vanicream, Vaseline)  all over while still damp. Dry skin makes the itching worse.  Moisturize at least 3-4 times a day. - Try anti- itch Sarna cream/lotion.       Return if symptoms worsen or fail to improve.  Alesia Morin, MD [facility]

## 2024-02-13 NOTE — Patient Instructions (Addendum)
 Pruritus:  - SPT 02/2024: negative to aeroallergens and commonly allergenic foods.  - Do a daily soaking tub bath in warm water for 10-15 minutes.  - Use a gentle, unscented cleanser at the end of the bath (such as Dove unscented bar or body wash or Aveeno sensitive body wash). Then rinse, pat half-way dry, and apply a gentle, unscented moisturizer cream or ointment (Cerave, Cetaphil, Eucerin, Aveeno, Aquaphor, Vanicream, Vaseline)  all over while still damp. Dry skin makes the itching worse.  Moisturize at least 3-4 times a day. - Try anti- itch Sarna cream/lotion.

## 2024-02-14 ENCOUNTER — Other Ambulatory Visit: Payer: Self-pay

## 2024-02-14 MED ORDER — HYDROXYCHLOROQUINE SULFATE 200 MG PO TABS
400.0000 mg | ORAL_TABLET | Freq: Every day | ORAL | 1 refills | Status: DC
  Filled 2024-02-14: qty 180, 90d supply, fill #0
  Filled 2024-05-23: qty 180, 90d supply, fill #1

## 2024-02-15 LAB — CYTOLOGY - PAP
Comment: NEGATIVE
Diagnosis: NEGATIVE
High risk HPV: NEGATIVE

## 2024-02-21 ENCOUNTER — Ambulatory Visit: Payer: Self-pay | Admitting: Allergy & Immunology

## 2024-02-28 ENCOUNTER — Other Ambulatory Visit: Payer: Self-pay

## 2024-02-28 MED ORDER — CLINDAMYCIN HCL 300 MG PO CAPS
600.0000 mg | ORAL_CAPSULE | ORAL | 1 refills | Status: AC
  Filled 2024-02-28: qty 4, 2d supply, fill #0
  Filled 2024-07-25: qty 4, 2d supply, fill #1

## 2024-02-29 ENCOUNTER — Ambulatory Visit: Payer: Commercial Managed Care - PPO | Admitting: Family Medicine

## 2024-02-29 ENCOUNTER — Other Ambulatory Visit: Payer: Self-pay

## 2024-02-29 ENCOUNTER — Encounter: Payer: Self-pay | Admitting: Family Medicine

## 2024-02-29 VITALS — BP 126/78 | HR 71 | Temp 98.5°F | Ht 62.0 in | Wt 199.6 lb

## 2024-02-29 DIAGNOSIS — F411 Generalized anxiety disorder: Secondary | ICD-10-CM | POA: Diagnosis not present

## 2024-02-29 MED ORDER — GABAPENTIN 300 MG PO CAPS
600.0000 mg | ORAL_CAPSULE | Freq: Every day | ORAL | 1 refills | Status: DC
  Filled 2024-02-29: qty 180, 90d supply, fill #0
  Filled 2024-05-23: qty 180, 90d supply, fill #1

## 2024-02-29 NOTE — Assessment & Plan Note (Signed)
 GAD 4. Would like to keep Lexapro dosage the same. Continue 10mg  daily. Denies SI/HI. Endorses some difficulty falling asleep. Will increase Gabapentin to 600mg  nightly. Return to office in 6 months or sooner if needed.

## 2024-02-29 NOTE — Progress Notes (Signed)
 Subjective:  HPI: Cheryl Shepherd is a 63 y.o. female presenting on 02/29/2024 for Follow-up (4 week f/u depression and anxiety.)   HPI Patient is in today for anxiety follow up. She reports today her symptoms are well controlled on Lexapro 10mg  daily without side effects. She is taking Melatonin and Gabapentin to help her sleep.     02/29/2024    4:55 PM 02/01/2024   11:44 AM 11/01/2023   11:20 AM 10/05/2023   12:28 PM  GAD 7 : Generalized Anxiety Score  Nervous, Anxious, on Edge 0 1 0 1  Control/stop worrying 1 1 0 1  Worry too much - different things 1 1 0 1  Trouble relaxing 1 1 0 1  Restless 0 1 0 1  Easily annoyed or irritable 0 0 0 0  Afraid - awful might happen 1 1 0 1  Total GAD 7 Score 4 6 0 6  Anxiety Difficulty  Somewhat difficult Not difficult at all Not difficult at all       02/29/2024    4:54 PM 02/01/2024   11:44 AM 11/01/2023   11:20 AM 10/05/2023   12:27 PM 02/10/2021    4:17 PM  Depression screen PHQ 2/9  Decreased Interest 0 0 0 1 0  Down, Depressed, Hopeless 0 0 0 0 0  PHQ - 2 Score 0 0 0 1 0  Altered sleeping 1 2 1 2    Tired, decreased energy 1 1 1 2    Change in appetite 0 0 0 0   Feeling bad or failure about yourself  0 0 0 0   Trouble concentrating 0 0 0 0   Moving slowly or fidgety/restless 0 0 0 0   Suicidal thoughts 0 0 0 0   PHQ-9 Score 2 3 2 5    Difficult doing work/chores  Somewhat difficult Somewhat difficult Not difficult at all       Review of Systems  Psychiatric/Behavioral:  Negative for hallucinations, substance abuse and suicidal ideas. The patient is nervous/anxious.   All other systems reviewed and are negative.   Relevant past medical history reviewed and updated as indicated.   Past Medical History:  Diagnosis Date   Anxiety    Cancer (HCC)    skin   Eczema    Hyperlipidemia    Restless leg syndrome    Rheumatoid arteritis (HCC)      Past Surgical History:  Procedure Laterality Date   ABLATION     CARPECTOMY      right wrist   CHOLECYSTECTOMY     COLONOSCOPY WITH PROPOFOL N/A 02/22/2020   Procedure: COLONOSCOPY WITH PROPOFOL;  Surgeon: Toney Reil, MD;  Location: [facility] ENDOSCOPY;  Service: Gastroenterology;  Laterality: N/A;   KNEE SURGERY  06/2021   total replacement on L-knee   REPLACEMENT TOTAL KNEE Left     Allergies and medications reviewed and updated.   Current Outpatient Medications:    Ascorbic Acid (VITAMIN C) 1000 MG tablet, Take 1,000 mg by mouth daily., Disp: , Rfl:    Cholecalciferol (VITAMIN D3) 125 MCG (5000 UT) CAPS, Take 1 capsule by mouth daily., Disp: , Rfl:    clindamycin (CLEOCIN) 300 MG capsule, Take 2 capsules, one hour before dental appt, Disp: 4 capsule, Rfl: 1   clobetasol ointment (TEMOVATE) 0.05 %, Apply thin film to affected rash on legs 2 times daily until clear for up to 2 weeks, then as needed for flares., Disp: 45 g, Rfl: 0   cyanocobalamin (VITAMIN  B12) 1000 MCG tablet, Take 1,000 mcg by mouth in the morning and at bedtime., Disp: , Rfl:    escitalopram (LEXAPRO) 10 MG tablet, Take 1 tablet (10 mg total) by mouth daily., Disp: 90 tablet, Rfl: 1   hydrochlorothiazide (HYDRODIURIL) 25 MG tablet, Take 1 tablet (25 mg total) by mouth daily., Disp: 90 tablet, Rfl: 1   hydroxychloroquine (PLAQUENIL) 200 MG tablet, Take 2 tablets (400 mg total) by mouth daily., Disp: 180 tablet, Rfl: 1   Hydroxychloroquine Sulfate 100 MG TABS, Take 2 tablets by mouth daily., Disp: , Rfl:    zinc gluconate 50 MG tablet, Take 50 mg by mouth daily., Disp: , Rfl:    clindamycin (CLEOCIN) 300 MG capsule, Take 2 capsules (600 mg total) by mouth 1 hour before dental appointment. (Patient not taking: Reported on 02/29/2024), Disp: 4 capsule, Rfl: 0   gabapentin (NEURONTIN) 300 MG capsule, Take 2 capsules (600 mg total) by mouth at bedtime., Disp: 180 capsule, Rfl: 1  Current Facility-Administered Medications:    betamethasone acetate-betamethasone sodium phosphate (CELESTONE) injection  3 mg, 3 mg, Intramuscular, Once, Evans, Larena Glassman, DPM  Allergies  Allergen Reactions   Cortisone Swelling, Hives and Other (See Comments)    Flushing   Penicillin G Other (See Comments)   Sulfa Antibiotics Nausea Only and Other (See Comments)   Sulfacetamide     Other reaction(s): NAUSEA   Penicillins Rash and Other (See Comments)    Objective:   BP 126/78   Pulse 71   Temp 98.5 F (36.9 C)   Ht 5\' 2"  (1.575 m)   Wt 199 lb 9.6 oz (90.5 kg)   SpO2 97%   BMI 36.51 kg/m      02/29/2024    4:13 PM 02/09/2024    1:16 PM 02/06/2024    1:53 PM  Vitals with BMI  Height 5\' 2"  5\' 2"  5' 2.205"  Weight 199 lbs 10 oz 199 lbs 200 lbs 2 oz  BMI 36.5 36.39 36.36  Systolic 126 128 409  Diastolic 78 72 70  Pulse 71  81     Physical Exam Vitals and nursing note reviewed.  Constitutional:      Appearance: Normal appearance. She is normal weight.  HENT:     Head: Normocephalic and atraumatic.  Skin:    General: Skin is warm and dry.  Neurological:     General: No focal deficit present.     Mental Status: She is alert and oriented to person, place, and time. Mental status is at baseline.  Psychiatric:        Mood and Affect: Mood normal.        Behavior: Behavior normal.        Thought Content: Thought content normal.        Judgment: Judgment normal.     Assessment & Plan:  Generalized anxiety disorder Assessment & Plan: GAD 4. Would like to keep Lexapro dosage the same. Continue 10mg  daily. Denies SI/HI. Endorses some difficulty falling asleep. Will increase Gabapentin to 600mg  nightly. Return to office in 6 months or sooner if needed.   Other orders -     Gabapentin; Take 2 capsules (600 mg total) by mouth at bedtime.  Dispense: 180 capsule; Refill: 1     Follow up plan: Return in about 7 months (around 09/30/2024) for annual physical with labs 1 week prior.  Park Meo, FNP

## 2024-03-07 DIAGNOSIS — M7918 Myalgia, other site: Secondary | ICD-10-CM | POA: Diagnosis not present

## 2024-03-07 DIAGNOSIS — M9901 Segmental and somatic dysfunction of cervical region: Secondary | ICD-10-CM | POA: Diagnosis not present

## 2024-03-07 DIAGNOSIS — M542 Cervicalgia: Secondary | ICD-10-CM | POA: Diagnosis not present

## 2024-03-07 DIAGNOSIS — M9904 Segmental and somatic dysfunction of sacral region: Secondary | ICD-10-CM | POA: Diagnosis not present

## 2024-03-07 DIAGNOSIS — M50322 Other cervical disc degeneration at C5-C6 level: Secondary | ICD-10-CM | POA: Diagnosis not present

## 2024-03-07 DIAGNOSIS — M9905 Segmental and somatic dysfunction of pelvic region: Secondary | ICD-10-CM | POA: Diagnosis not present

## 2024-03-07 DIAGNOSIS — M9903 Segmental and somatic dysfunction of lumbar region: Secondary | ICD-10-CM | POA: Diagnosis not present

## 2024-03-07 DIAGNOSIS — M5136 Other intervertebral disc degeneration, lumbar region with discogenic back pain only: Secondary | ICD-10-CM | POA: Diagnosis not present

## 2024-03-07 DIAGNOSIS — M50323 Other cervical disc degeneration at C6-C7 level: Secondary | ICD-10-CM | POA: Diagnosis not present

## 2024-03-07 DIAGNOSIS — M5451 Vertebrogenic low back pain: Secondary | ICD-10-CM | POA: Diagnosis not present

## 2024-04-05 DIAGNOSIS — M50322 Other cervical disc degeneration at C5-C6 level: Secondary | ICD-10-CM | POA: Diagnosis not present

## 2024-04-05 DIAGNOSIS — M9901 Segmental and somatic dysfunction of cervical region: Secondary | ICD-10-CM | POA: Diagnosis not present

## 2024-04-05 DIAGNOSIS — M5451 Vertebrogenic low back pain: Secondary | ICD-10-CM | POA: Diagnosis not present

## 2024-04-05 DIAGNOSIS — M9904 Segmental and somatic dysfunction of sacral region: Secondary | ICD-10-CM | POA: Diagnosis not present

## 2024-04-05 DIAGNOSIS — M7918 Myalgia, other site: Secondary | ICD-10-CM | POA: Diagnosis not present

## 2024-04-05 DIAGNOSIS — M5136 Other intervertebral disc degeneration, lumbar region with discogenic back pain only: Secondary | ICD-10-CM | POA: Diagnosis not present

## 2024-04-05 DIAGNOSIS — M9905 Segmental and somatic dysfunction of pelvic region: Secondary | ICD-10-CM | POA: Diagnosis not present

## 2024-04-05 DIAGNOSIS — M50323 Other cervical disc degeneration at C6-C7 level: Secondary | ICD-10-CM | POA: Diagnosis not present

## 2024-04-05 DIAGNOSIS — M542 Cervicalgia: Secondary | ICD-10-CM | POA: Diagnosis not present

## 2024-04-05 DIAGNOSIS — M9903 Segmental and somatic dysfunction of lumbar region: Secondary | ICD-10-CM | POA: Diagnosis not present

## 2024-04-26 DIAGNOSIS — M0609 Rheumatoid arthritis without rheumatoid factor, multiple sites: Secondary | ICD-10-CM | POA: Diagnosis not present

## 2024-04-26 DIAGNOSIS — Z7960 Long term (current) use of unspecified immunomodulators and immunosuppressants: Secondary | ICD-10-CM | POA: Diagnosis not present

## 2024-04-26 DIAGNOSIS — M150 Primary generalized (osteo)arthritis: Secondary | ICD-10-CM | POA: Diagnosis not present

## 2024-05-23 ENCOUNTER — Other Ambulatory Visit: Payer: Self-pay

## 2024-06-04 ENCOUNTER — Other Ambulatory Visit: Payer: Self-pay

## 2024-06-04 MED ORDER — NEOMYCIN-POLYMYXIN-DEXAMETH 0.1 % OP SUSP
1.0000 [drp] | Freq: Three times a day (TID) | OPHTHALMIC | 0 refills | Status: AC
  Filled 2024-06-04: qty 5, 30d supply, fill #0

## 2024-06-18 DIAGNOSIS — M5451 Vertebrogenic low back pain: Secondary | ICD-10-CM | POA: Diagnosis not present

## 2024-06-18 DIAGNOSIS — M9904 Segmental and somatic dysfunction of sacral region: Secondary | ICD-10-CM | POA: Diagnosis not present

## 2024-06-18 DIAGNOSIS — M9901 Segmental and somatic dysfunction of cervical region: Secondary | ICD-10-CM | POA: Diagnosis not present

## 2024-06-18 DIAGNOSIS — M9905 Segmental and somatic dysfunction of pelvic region: Secondary | ICD-10-CM | POA: Diagnosis not present

## 2024-06-18 DIAGNOSIS — M50322 Other cervical disc degeneration at C5-C6 level: Secondary | ICD-10-CM | POA: Diagnosis not present

## 2024-06-18 DIAGNOSIS — M7918 Myalgia, other site: Secondary | ICD-10-CM | POA: Diagnosis not present

## 2024-06-18 DIAGNOSIS — M50323 Other cervical disc degeneration at C6-C7 level: Secondary | ICD-10-CM | POA: Diagnosis not present

## 2024-06-18 DIAGNOSIS — M542 Cervicalgia: Secondary | ICD-10-CM | POA: Diagnosis not present

## 2024-06-18 DIAGNOSIS — M5136 Other intervertebral disc degeneration, lumbar region with discogenic back pain only: Secondary | ICD-10-CM | POA: Diagnosis not present

## 2024-06-18 DIAGNOSIS — M9903 Segmental and somatic dysfunction of lumbar region: Secondary | ICD-10-CM | POA: Diagnosis not present

## 2024-07-05 ENCOUNTER — Ambulatory Visit: Admitting: Family Medicine

## 2024-07-05 ENCOUNTER — Other Ambulatory Visit: Payer: Self-pay

## 2024-07-05 ENCOUNTER — Encounter: Payer: Self-pay | Admitting: Family Medicine

## 2024-07-05 ENCOUNTER — Ambulatory Visit: Payer: Self-pay

## 2024-07-05 VITALS — BP 130/79 | HR 74 | Temp 98.5°F | Ht 62.0 in | Wt 197.8 lb

## 2024-07-05 DIAGNOSIS — M5432 Sciatica, left side: Secondary | ICD-10-CM | POA: Diagnosis not present

## 2024-07-05 DIAGNOSIS — G4733 Obstructive sleep apnea (adult) (pediatric): Secondary | ICD-10-CM | POA: Diagnosis not present

## 2024-07-05 DIAGNOSIS — M7918 Myalgia, other site: Secondary | ICD-10-CM | POA: Diagnosis not present

## 2024-07-05 MED ORDER — ZEPBOUND 2.5 MG/0.5ML ~~LOC~~ SOAJ
2.5000 mg | SUBCUTANEOUS | 1 refills | Status: DC
  Filled 2024-07-05: qty 2, 28d supply, fill #0

## 2024-07-05 MED ORDER — METHOCARBAMOL 750 MG PO TABS
750.0000 mg | ORAL_TABLET | Freq: Three times a day (TID) | ORAL | 0 refills | Status: DC
  Filled 2024-07-05: qty 30, 10d supply, fill #0

## 2024-07-05 NOTE — Assessment & Plan Note (Signed)
 Symptoms difficult to discern if lumbar radiculopathy or musculoskeletal. She has a historical reaction to cortisone injection so will defer prednisone for now and proceed with x-ray. Start Robaxin  750mg  TID PRN and continue NSAID and heat. Stretch handout provided. Follow up PRN.

## 2024-07-05 NOTE — Telephone Encounter (Signed)
 FYI Only or Action Required?: FYI only for provider.  Patient was last seen in primary care on 02/29/2024 by Kayla Jeoffrey RAMAN, FNP.  Called Nurse Triage reporting Back Pain.  Symptoms began a week ago.overall symptoms worsened over the last week.  Interventions attempted: OTC medications: ibuprofen, Rest, hydration, or home remedies, and Ice/heat application.  Symptoms are: unchanged.  Triage Disposition: See PCP When Office is Open (Within 3 Days)  Patient/caregiver understands and will follow disposition?: Yes  Copied from CRM [id number]. Topic: Clinical - Red Word Triage >> Jul 05, 2024  9:44 AM Deleta RAMAN wrote: Red Word that prompted transfer to Nurse Triage: patient has been experiencing back pain off and on for about 6- 8 months. The pain is worsening and traveling through her side and bottom area Reason for Disposition  [1] MODERATE back pain (e.g., interferes with normal activities) AND [2] present > 3 days  Answer Assessment - Initial Assessment Questions 1. ONSET: When did the pain begin? (e.g., minutes, hours, days)     Started 6-8 months ago-pain has worsened this week 2. LOCATION: Where does it hurt? (upper, mid or lower back)     Left sided lower back pain that goes into her leg 3. SEVERITY: How bad is the pain?  (e.g., Scale 1-10; mild, moderate, or severe)     6 out of 10 4. PATTERN: Is the pain constant? (e.g., yes, no; constant, intermittent)      constant 5. RADIATION: Does the pain shoot into your legs or somewhere else?     yes 6. CAUSE:  What do you think is causing the back pain?      unsure 7. BACK OVERUSE:  Any recent lifting of heavy objects, strenuous work or exercise?     exercise 8. MEDICINES: What have you taken so far for the pain? (e.g., nothing, acetaminophen, NSAIDS)     ibuprofen 9. NEUROLOGIC SYMPTOMS: Do you have any weakness, numbness, or problems with bowel/bladder control?     no 10. OTHER SYMPTOMS: Do you have any other  symptoms? (e.g., fever, abdomen pain, burning with urination, blood in urine)       no  Protocols used: Back Pain-A-AH

## 2024-07-05 NOTE — Progress Notes (Signed)
 Subjective:  HPI: Cheryl Shepherd is a 63 y.o. female presenting on 07/05/2024 for Acute Visit (Back pain in mid back causing pain when sitting. Pain in lft hip and buttock. )   HPI Patient is in today for left buttock pain that radiates down the back of her buttock and thigh to her posterior knee. This has been ongoing for 3 months and has been worse the past 2 weeks.  Has tried ice and ibuprofen. Has also been seeing a [facility]. Worse when she bends her torso to the right. No saddle numbness, lower extremity weakness, or incontinence of urine or stool. No history of low back pain or similar, has had mid upper back pain in the past that was self limited.  She reports a history of hives and flushing with wrist cortisone injection in the past however has had several other cortisone injections she did not react to.   Cheryl Shepherd is also interested in Zepbound  for her sleep apnea. No personal history of pancreatitis or personal or family history of MEN2 or MTC. She does have OSA with CPAP use. No DM or history of gastric bypass surgery. Has tried diet and exercise. Exercise is very limited right now due to this buttock pain.   Review of Systems  All other systems reviewed and are negative.   Relevant past medical history reviewed and updated as indicated.   Past Medical History:  Diagnosis Date   Anxiety    Cancer (HCC)    skin   Eczema    Hyperlipidemia    Hypertension    Restless leg syndrome    Rheumatoid arteritis (HCC)    Sleep apnea      Past Surgical History:  Procedure Laterality Date   ABLATION     CARPECTOMY     right wrist   CHOLECYSTECTOMY     COLONOSCOPY WITH PROPOFOL  N/A 02/22/2020   Procedure: COLONOSCOPY WITH PROPOFOL ;  Surgeon: Unk Corinn Skiff, MD;  Location: [facility] ENDOSCOPY;  Service: Gastroenterology;  Laterality: N/A;   EYE SURGERY     JOINT REPLACEMENT     KNEE SURGERY  06/2021   total replacement on L-knee   REPLACEMENT TOTAL KNEE Left      Allergies and medications reviewed and updated.   Current Outpatient Medications:    Ascorbic Acid (VITAMIN C) 1000 MG tablet, Take 1,000 mg by mouth daily., Disp: , Rfl:    Cholecalciferol (VITAMIN D3) 125 MCG (5000 UT) CAPS, Take 1 capsule by mouth daily., Disp: , Rfl:    clobetasol  ointment (TEMOVATE ) 0.05 %, Apply thin film to affected rash on legs 2 times daily until clear for up to 2 weeks, then as needed for flares., Disp: 45 g, Rfl: 0   cyanocobalamin  (VITAMIN B12) 1000 MCG tablet, Take 1,000 mcg by mouth in the morning and at bedtime., Disp: , Rfl:    escitalopram  (LEXAPRO ) 10 MG tablet, Take 1 tablet (10 mg total) by mouth daily., Disp: 90 tablet, Rfl: 1   gabapentin  (NEURONTIN ) 300 MG capsule, Take 2 capsules (600 mg total) by mouth at bedtime., Disp: 180 capsule, Rfl: 1   hydrochlorothiazide  (HYDRODIURIL ) 25 MG tablet, Take 1 tablet (25 mg total) by mouth daily., Disp: 90 tablet, Rfl: 1   hydroxychloroquine  (PLAQUENIL ) 200 MG tablet, Take 2 tablets (400 mg total) by mouth daily., Disp: 180 tablet, Rfl: 1   methocarbamol  (ROBAXIN ) 750 MG tablet, Take 1 tablet (750 mg total) by mouth 3 (three) times daily., Disp: 30 tablet, Rfl: 0  neomycin -polymyxin-dexamethasone  (MAXITROL ) 0.1 % ophthalmic suspension, Place 1 drop into affected eye 3 (three) times daily., Disp: 5 mL, Rfl: 0   zinc gluconate 50 MG tablet, Take 50 mg by mouth daily., Disp: , Rfl:    clindamycin  (CLEOCIN ) 300 MG capsule, Take 2 capsules (600 mg total) by mouth 1 hour before dental appointment. (Patient not taking: Reported on 07/05/2024), Disp: 4 capsule, Rfl: 0   clindamycin  (CLEOCIN ) 300 MG capsule, Take 2 capsules, one hour before dental appt (Patient not taking: Reported on 07/05/2024), Disp: 4 capsule, Rfl: 1   Hydroxychloroquine  Sulfate 100 MG TABS, Take 2 tablets by mouth daily. (Patient not taking: Reported on 07/05/2024), Disp: , Rfl:   Current Facility-Administered Medications:    betamethasone   acetate-betamethasone  sodium phosphate (CELESTONE ) injection 3 mg, 3 mg, Intramuscular, Once, Evans, Thresa HERO, DPM  Allergies  Allergen Reactions   Cortisone Swelling, Hives and Other (See Comments)    Flushing   Diclofenac  Other (See Comments)   Leflunomide Nausea Only   Methotrexate Diarrhea   Penicillin G Other (See Comments)   Sulfa Antibiotics Nausea Only and Other (See Comments)   Sulfacetamide     Other reaction(s): NAUSEA   Penicillins Rash and Other (See Comments)    Objective:   BP 130/79   Pulse 74   Temp 98.5 F (36.9 C)   Ht 5' 2 (1.575 m)   Wt 197 lb 12.8 oz (89.7 kg)   SpO2 98%   BMI 36.18 kg/m      07/05/2024    3:57 PM 02/29/2024    4:13 PM 02/09/2024    1:16 PM  Vitals with BMI  Height 5' 2 5' 2 5' 2  Weight 197 lbs 13 oz 199 lbs 10 oz 199 lbs  BMI 36.17 36.5 36.39  Systolic 130 126 871  Diastolic 79 78 72  Pulse 74 71      Physical Exam Vitals and nursing note reviewed.  Constitutional:      Appearance: Normal appearance. She is obese.  HENT:     Head: Normocephalic and atraumatic.  Musculoskeletal:     Lumbar back: Normal. No bony tenderness. Negative right straight leg raise test and negative left straight leg raise test.     Left hip: Tenderness present. Normal range of motion. Normal strength.       Legs:  Skin:    General: Skin is warm and dry.  Neurological:     General: No focal deficit present.     Mental Status: She is alert and oriented to person, place, and time. Mental status is at baseline.  Psychiatric:        Mood and Affect: Mood normal.        Behavior: Behavior normal.        Thought Content: Thought content normal.        Judgment: Judgment normal.     Assessment & Plan:  Left sided sciatica Assessment & Plan: Symptoms difficult to discern if lumbar radiculopathy or musculoskeletal. She has a historical reaction to cortisone injection so will defer prednisone for now and proceed with x-ray. Start Robaxin  750mg   TID PRN and continue NSAID and heat. Stretch handout provided. Follow up PRN.   Orders: -     DG Lumbar Spine Complete; Future  Left buttock pain -     Methocarbamol ; Take 1 tablet (750 mg total) by mouth 3 (three) times daily.  Dispense: 30 tablet; Refill: 0  Obstructive sleep apnea syndrome Assessment & Plan: Using CPAP. Start  Zepbound  2.5mg  weekly and increase as tolerated. Notify office for side effects. Follow up 4 weeks after starting.      Follow up plan: Return in about 4 weeks (around 08/02/2024), or if symptoms worsen or fail to improve, for weight management.  Jeoffrey GORMAN Barrio, FNP

## 2024-07-05 NOTE — Assessment & Plan Note (Signed)
 Using CPAP. Start Zepbound  2.5mg  weekly and increase as tolerated. Notify office for side effects. Follow up 4 weeks after starting.

## 2024-07-09 ENCOUNTER — Encounter: Payer: Self-pay | Admitting: Family Medicine

## 2024-07-09 ENCOUNTER — Telehealth (INDEPENDENT_AMBULATORY_CARE_PROVIDER_SITE_OTHER): Admitting: Family Medicine

## 2024-07-09 DIAGNOSIS — Z6836 Body mass index (BMI) 36.0-36.9, adult: Secondary | ICD-10-CM

## 2024-07-09 NOTE — Progress Notes (Signed)
 Virtual Visit via Video note  I connected with Cheryl Shepherd on 07/09/24 at 1358 by video and verified that I am speaking with the correct person using two identifiers. Cheryl Shepherd is currently located at home and no one is currently with her during visit. The provider, Jeoffrey GORMAN Barrio, FNP is located in their office at time of visit.  I discussed the limitations, risks, security and privacy concerns of performing an evaluation and management service by video and the availability of in person appointments. I also discussed with the patient that there may be a patient responsible charge related to this service. The patient expressed understanding and agreed to proceed.  Subjective: PCP: Barrio Jeoffrey GORMAN, FNP  No chief complaint on file.   HPI Pt being seen today for follow up on recent visit for low back pain and discussion of medication for weight management. Her back pain is improved with Robaxin , has not had x-ray performed yet. At her last OV Ms Talmadge and I discussed options for management of her weight and comorbid conditions. Zepbound  was approved however the cost was unaffordable. She would like referral to [facility].     ROS: Per HPI  Current Outpatient Medications:    Ascorbic Acid (VITAMIN C) 1000 MG tablet, Take 1,000 mg by mouth daily., Disp: , Rfl:    Cholecalciferol (VITAMIN D3) 125 MCG (5000 UT) CAPS, Take 1 capsule by mouth daily., Disp: , Rfl:    clindamycin  (CLEOCIN ) 300 MG capsule, Take 2 capsules (600 mg total) by mouth 1 hour before dental appointment. (Patient not taking: Reported on 07/05/2024), Disp: 4 capsule, Rfl: 0   clindamycin  (CLEOCIN ) 300 MG capsule, Take 2 capsules, one hour before dental appt (Patient not taking: Reported on 07/05/2024), Disp: 4 capsule, Rfl: 1   clobetasol  ointment (TEMOVATE ) 0.05 %, Apply thin film to affected rash on legs 2 times daily until clear for up to 2 weeks, then as needed for flares., Disp: 45 g, Rfl: 0   cyanocobalamin  (VITAMIN  B12) 1000 MCG tablet, Take 1,000 mcg by mouth in the morning and at bedtime., Disp: , Rfl:    escitalopram  (LEXAPRO ) 10 MG tablet, Take 1 tablet (10 mg total) by mouth daily., Disp: 90 tablet, Rfl: 1   gabapentin  (NEURONTIN ) 300 MG capsule, Take 2 capsules (600 mg total) by mouth at bedtime., Disp: 180 capsule, Rfl: 1   hydrochlorothiazide  (HYDRODIURIL ) 25 MG tablet, Take 1 tablet (25 mg total) by mouth daily., Disp: 90 tablet, Rfl: 1   hydroxychloroquine  (PLAQUENIL ) 200 MG tablet, Take 2 tablets (400 mg total) by mouth daily., Disp: 180 tablet, Rfl: 1   Hydroxychloroquine  Sulfate 100 MG TABS, Take 2 tablets by mouth daily. (Patient not taking: Reported on 07/05/2024), Disp: , Rfl:    methocarbamol  (ROBAXIN ) 750 MG tablet, Take 1 tablet (750 mg total) by mouth 3 (three) times daily., Disp: 30 tablet, Rfl: 0   neomycin -polymyxin-dexamethasone  (MAXITROL ) 0.1 % ophthalmic suspension, Place 1 drop into affected eye 3 (three) times daily., Disp: 5 mL, Rfl: 0   tirzepatide  (ZEPBOUND ) 2.5 MG/0.5ML Pen, Inject 2.5 mg into the skin once a week., Disp: 2 mL, Rfl: 1   zinc gluconate 50 MG tablet, Take 50 mg by mouth daily., Disp: , Rfl:   Current Facility-Administered Medications:    betamethasone  acetate-betamethasone  sodium phosphate (CELESTONE ) injection 3 mg, 3 mg, Intramuscular, Once, Evans, Thresa HERO, DPM  Observations/Objective: Physical Exam Constitutional:      General: She is not in acute distress.  Appearance: Normal appearance. She is not toxic-appearing.  Eyes:     Conjunctiva/sclera: Conjunctivae normal.  Pulmonary:     Effort: Pulmonary effort is normal. No respiratory distress.  Skin:    Coloration: Skin is not pale.  Neurological:     General: No focal deficit present.     Mental Status: She is alert and oriented to person, place, and time.  Psychiatric:        Mood and Affect: Mood normal.        Behavior: Behavior normal.        Thought Content: Thought content normal.         Judgment: Judgment normal.    Assessment and Plan: There are no diagnoses linked to this encounter.  Follow Up Instructions: No follow-ups on file.   I discussed the assessment and treatment plan with the patient. The patient was provided an opportunity to ask questions and all were answered. The patient agreed with the plan and demonstrated an understanding of the instructions.   The patient was advised to call back or seek an in-person evaluation if the symptoms worsen or if the condition fails to improve as anticipated.  The above assessment and management plan was discussed with the patient. The patient verbalized understanding of and has agreed to the management plan. Patient is aware to call the clinic if symptoms persist or worsen. Patient is aware when to return to the clinic for a follow-up visit. Patient educated on when it is appropriate to go to the emergency department.   Time call ended: 1409  I provided 11 minutes of face-to-face time during this encounter.   Jeoffrey Barrio, MSN, APRN, FNP-C [facility]

## 2024-07-10 ENCOUNTER — Encounter (INDEPENDENT_AMBULATORY_CARE_PROVIDER_SITE_OTHER): Payer: Self-pay

## 2024-07-11 ENCOUNTER — Other Ambulatory Visit (HOSPITAL_COMMUNITY): Payer: Self-pay

## 2024-07-11 NOTE — Telephone Encounter (Signed)
 Good morning, I just got off the phone with [employer] (spoke with Gracelyn) Unfortunately Ms. Gianino plan do not cover any GLP's for weight loss. According to her plan all GLP's are excluded and an exception process (PA) is not available. This applies to anyone with [employer].

## 2024-07-18 ENCOUNTER — Encounter (INDEPENDENT_AMBULATORY_CARE_PROVIDER_SITE_OTHER): Payer: Self-pay | Admitting: Family Medicine

## 2024-07-18 ENCOUNTER — Ambulatory Visit (INDEPENDENT_AMBULATORY_CARE_PROVIDER_SITE_OTHER): Admitting: Family Medicine

## 2024-07-18 VITALS — BP 113/67 | HR 59 | Temp 98.0°F | Ht 62.5 in | Wt 190.0 lb

## 2024-07-18 DIAGNOSIS — Z6834 Body mass index (BMI) 34.0-34.9, adult: Secondary | ICD-10-CM

## 2024-07-18 DIAGNOSIS — R7303 Prediabetes: Secondary | ICD-10-CM | POA: Diagnosis not present

## 2024-07-18 DIAGNOSIS — E66811 Obesity, class 1: Secondary | ICD-10-CM | POA: Diagnosis not present

## 2024-07-18 DIAGNOSIS — Z0289 Encounter for other administrative examinations: Secondary | ICD-10-CM

## 2024-07-18 DIAGNOSIS — E785 Hyperlipidemia, unspecified: Secondary | ICD-10-CM | POA: Diagnosis not present

## 2024-07-18 NOTE — Progress Notes (Signed)
 Barnie DOROTHA Jenkins, DO, ABFM, ABOM Bariatric physician [address] [place], [place], [place] [address] Office: [phone]  /  Fax: [phone]     Initial Evaluation:  Cheryl Shepherd was seen in clinic today to evaluate for obesity. Cheryl Shepherd is interested in losing weight to improve overall health and reduce the risk of weight related complications. Cheryl Shepherd presents today to review program treatment options, initial physical assessment, and evaluation.     Cheryl Shepherd was referred by: PCP -- Jeoffrey Barrio, NP  When asked what they hope to accomplish? Cheryl Shepherd Shepherd: Improve self-confidence, improve quality of life, improve appearance.  When asked how has your weight affected you? Cheryl Shepherd Shepherd: Has affected self-esteem, Having fatigue, and Having poor endurance and problems with anxiety.   Contributing factors to Cheryl Shepherd weight change: nutritional, reduced physical activity, problems with eating patterns (skipping meals), life event (having children - last born in 79).   Some associated conditions: Hypertension, Arthritis:(knee), Hyperlipidemia, and Other: GAD  Current nutrition plan: None  Current level of physical activity: None -- restarted yesterday; exercise at the [facility] 30 minutes 3 days/week.   Current or previous pharmacotherapy: None currently. Metformin (discontinued).   Response to medication: Had side effects so it was discontinued. Cheryl Shepherd it made Cheryl Shepherd ill.    Identifies the following barriers to Cheryl Shepherd weight loss: sitting at desk/inactivity, fatigue after working long  days, no time for E, poor eating habits -- Cheryl Shepherd adds these have been challenges for 30+ yrs.   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 Lives with Cheryl Shepherd husband. Cheryl Shepherd has lost 40 lbs about 3 years ago while doing [employer], prior to Cheryl Shepherd L total knee replacement, doing [employer]   Past Medical History:  Diagnosis Date   Anxiety    Cancer (HCC)    skin   Eczema    Hyperlipidemia    Hypertension    Restless leg syndrome     Rheumatoid arteritis (HCC)    Sleep apnea     Current Outpatient Medications  Medication Instructions   Cholecalciferol (VITAMIN D3) 125 MCG (5000 UT) CAPS 1 capsule, Daily   clindamycin  (CLEOCIN ) 300 MG capsule Take 2 capsules (600 mg total) by mouth 1 hour before dental appointment.   clindamycin  (CLEOCIN ) 300 MG capsule Take 2 capsules, one hour before dental appt   clobetasol  ointment (TEMOVATE ) 0.05 % Apply thin film to affected rash on legs 2 times daily until clear for up to 2 weeks, then as needed for flares.   cyanocobalamin  (VITAMIN B12) 1,000 mcg, 2 times daily   escitalopram  (LEXAPRO ) 10 mg, Oral, Daily   gabapentin  (NEURONTIN ) 600 mg, Oral, Daily at bedtime   hydrochlorothiazide  (HYDRODIURIL ) 25 mg, Oral, Daily   hydroxychloroquine  (PLAQUENIL ) 400 mg, Oral, Daily   Hydroxychloroquine  Sulfate 100 MG TABS 2 tablets, Daily   methocarbamol  (ROBAXIN ) 750 mg, Oral, 3 times daily   neomycin -polymyxin-dexamethasone  (MAXITROL ) 0.1 % ophthalmic suspension Place 1 drop into affected eye 3 (three) times daily.   vitamin C 1,000 mg, Daily   Zepbound  2.5 mg, Subcutaneous, Weekly   zinc gluconate 50 mg, Daily     Allergies  Allergen Reactions   Cortisone Swelling, Hives and Other (See Comments)    Flushing   Diclofenac  Other (See Comments)   Leflunomide Nausea Only   Methotrexate Diarrhea   Penicillin G Other (See Comments)   Sulfa Antibiotics Nausea Only and Other (See Comments)   Sulfacetamide     Other reaction(s): NAUSEA   Penicillins Rash and Other (See Comments)  Past Surgical History:  Procedure Laterality Date   ABLATION     CARPECTOMY     right wrist   CHOLECYSTECTOMY     COLONOSCOPY WITH PROPOFOL  N/A 02/22/2020   Procedure: COLONOSCOPY WITH PROPOFOL ;  Surgeon: Unk Corinn Skiff, MD;  Location: [facility] ENDOSCOPY;  Service: Gastroenterology;  Laterality: N/A;   EYE SURGERY     JOINT REPLACEMENT     KNEE SURGERY  06/2021   total replacement on L-knee    REPLACEMENT TOTAL KNEE Left      Family History  Problem Relation Age of Onset   Breast cancer Mother 30   Arthritis Mother    Allergic rhinitis Sister    Other Sister        genetic testing neg (mother of niece and nephew with cancer)   Colon cancer Brother    Colon cancer Nephew 50   Breast cancer Niece 69   Arthritis Father    Heart disease Father    Kidney disease Father    Heart disease Brother    Angioedema Neg Hx    Asthma Neg Hx    Eczema Neg Hx    Urticaria Neg Hx      Objective:  BP 113/67   Pulse (!) 59   Temp 98 F (36.7 C)   Ht 5' 2.5 (1.588 m)   Wt 190 lb (86.2 kg)   SpO2 98%   BMI 34.20 kg/m  Cheryl Shepherd was weighed on the bioimpedance scale: Body mass index is 34.2 kg/m.  Visceral Fat rating : 13 , Body Fat %: 44 %   Vitals Temp: 98 F (36.7 C) BP: 113/67 Pulse Rate: (!) 59 SpO2: 98 %   Anthropometric Measurements Height: 5' 2.5 (1.588 m) Weight: 190 lb (86.2 kg) BMI (Calculated): 34.18 Weight at Last Visit: NA Weight Lost Since Last Visit: NA Weight Gained Since Last Visit: NA Starting Weight: NA Total Weight Loss (lbs): -- (NA) Peak Weight: 220lb Waist Measurement : -- (NA)   Body Composition  Body Fat %: 44 % Fat Mass (lbs): 83.6 lbs Muscle Mass (lbs): 101.2 lbs Total Body Water (lbs): 82.2 lbs Visceral Fat Rating : 13   Other Clinical Data A1c: -- (NA) RMR: -- (NA) Fasting: No Labs: No Today's Visit #: Consult Starting Date: -- (Consult) Comments: Consult     General: Well Developed, well nourished, and in no acute distress.  HEENT: Normocephalic, atraumatic; EOMI, sclerae are anicteric. Skin: Warm and dry, good turgor Chest:  Normal excursion, shape, no gross ABN Respiratory: No conversational dyspnea; speaking in full sentences NeuroM-Sk:  Normal gross ROM * 4 extremities  Psych: A and O *3, insight adequate, mood- full    Assessment and Plan:   FOR THE DISEASE OF OBESITY:  Class 1 obesity with body mass index  (BMI) of 34.0 to 34.9 in adult, unspecified obesity type, unspecified whether serious comorbidity present BMI 34.0-34.9,adult Assessment & Plan: We reviewed anthropometrics, biometrics, associated medical conditions and contributing factors with patient. Cheryl Shepherd would benefit from a medically tailored reduced calorie nutrional plan based on their REE (resting energy expenditure), which will be determined by indirect calorimetry.  We will also assess for cardiometabolic risk and nutritional derangements via fasting labs at intake appointment.    Obesity Treatment / Action Plan:   Cheryl Shepherd was weighed on the bioimpedance scale and results were discussed and documented in the synopsis.   Cheryl Shepherd will complete provided nutritional and psychosocial assessment questionnaire before the next appointment.  Cheryl Shepherd will be  scheduled for indirect calorimetry to determine resting energy expenditure in a fasting state.  This will allow us  to create a reduced calorie, high-protein meal plan to promote loss of fat mass while preserving muscle mass.  We will also assess for cardiometabolic risk and nutritional derangements via an ECG and fasting serologies at Cheryl Shepherd next appointment.  Cheryl Shepherd was encouraged to work on amassing support from family and friends to begin their weight loss journey.   Work on eliminating or reducing the presence of highly processed, poorly nutritious, calorie-dense foods in the home.   Obesity Education Performed Today:  Patient was counseled on nutritional approaches to weight loss and benefits of reducing processed foods and consuming plant-based foods and high quality protein as part of nutritional weight management program.   We discussed the importance of long term lifestyle changes which include nutrition, exercise and behavioral modifications as well as the importance of customizing this to Cheryl Shepherd specific health and social needs.   We discussed the benefits of reaching a healthier  weight to alleviate the symptoms of existing conditions and reduce the risks of the biomechanical, metabolic and psychological effects of obesity.  Was counseled on the health benefits of losing 5%-10% of total body weight.  Was counseled on our cognitive behavorial therapy program, lead by our bariatric psychologist, who focuses on emotional eating and creating positive behavorial change.  Was counseled on bariatric pharmacotherapy and how this may be used as an adjunct in their weight management    Demira appears to be in the action stage of change and Shepherd they are ready to start intensive lifestyle modifications and behavioral modifications.  It was recommended that Cheryl Shepherd follow up in the next 1-2 weeks to review the above steps, and to continue with treatment of their chronic disease state of obesity   FOR OTHER CONDITIONS RELATED TO THE DISEASE OF OBESITY:  Hyperlipidemia, unspecified hyperlipidemia type Assessment & Plan: Diagnosed about 1 year ago. PCP recommended Cheryl Shepherd start statin therapy. Cheryl Shepherd declined statin therapy and has tried diet/lifestyle changes. Has cut back on cheese, milk, and red meats.   Reviewed the importance of diet and exercise for management of condition and briefly discussed how medications are at times necessary for better control. Cheryl Shepherd would benefit from a low-cholesterol, low-carb, high protein/fiber nutritional diet. Will continue monitoring alongside PCP/specialists if Cheryl Shepherd desires to join the program.     Prediabetes Assessment & Plan: Lab Results  Component Value Date   HGBA1C 5.9 (H) 01/30/2024   HGBA1C 5.8 (H) 10/06/2023    Was initially told about Cheryl Shepherd prediabetic status 5-10 years ago. Denies any personal hx of diabetes today. Took Metformin for a short period of time in the past; stopped taking due to intolerances.    Reviewed the benefits of healthy diet and regular exercise for glycemic control and weight loss. Cheryl Shepherd would benefit from a low-carb and  high protein nutritional diet. Will continue monitoring alongside PCP/specialists if Cheryl Shepherd desires to join the program.    Attestations:   I, Vernell Forest, acting as a medical scribe for Barnie Jenkins, DO., have compiled all relevant documentation for today's office visit on behalf of Barnie Jenkins, DO, while in the presence of [employer], DO.  I have spent 46 minutes in the care of the patient today.  38 minutes was spent in face to face counseling of the patient on the disease of obesity and what our program can do for their medical conditions as well as in preventing future diseases. I  discussed the importance of comprehensive care in the treatment of obesity including mental well being and physical activity.   I have reviewed the above documentation for accuracy and completeness, and I agree with the above. Barnie JINNY Jenkins, D.O.  The 21st Century Cures Act was signed into law in 2016 which includes the topic of electronic health records.  This provides immediate access to information in MyChart.  This includes consultation notes, operative notes, office notes, lab results and pathology reports.  If you have any questions about what you read please let us  know at your next visit so we can discuss your concerns and take corrective action if need be.  We are right here with you!

## 2024-07-25 ENCOUNTER — Other Ambulatory Visit: Payer: Self-pay

## 2024-07-25 ENCOUNTER — Other Ambulatory Visit: Payer: Self-pay | Admitting: Family Medicine

## 2024-07-25 MED ORDER — ESCITALOPRAM OXALATE 10 MG PO TABS
10.0000 mg | ORAL_TABLET | Freq: Every day | ORAL | 1 refills | Status: DC
  Filled 2024-07-25: qty 90, 90d supply, fill #0
  Filled 2024-08-17: qty 90, 90d supply, fill #1

## 2024-07-29 ENCOUNTER — Ambulatory Visit
Admission: RE | Admit: 2024-07-29 | Discharge: 2024-07-29 | Disposition: A | Discharge status: Home / Self Care | Attending: Emergency Medicine | Admitting: Emergency Medicine

## 2024-07-29 ENCOUNTER — Emergency Department

## 2024-07-29 ENCOUNTER — Emergency Department: Admission: EM | Admit: 2024-07-29 | Discharge: 2024-07-29 | Disposition: A | Discharge status: Home / Self Care

## 2024-07-29 ENCOUNTER — Other Ambulatory Visit: Payer: Self-pay

## 2024-07-29 VITALS — BP 121/69 | HR 71 | Temp 99.0°F | Resp 20

## 2024-07-29 DIAGNOSIS — J069 Acute upper respiratory infection, unspecified: Secondary | ICD-10-CM | POA: Diagnosis not present

## 2024-07-29 DIAGNOSIS — R062 Wheezing: Secondary | ICD-10-CM

## 2024-07-29 DIAGNOSIS — R059 Cough, unspecified: Secondary | ICD-10-CM | POA: Diagnosis present

## 2024-07-29 DIAGNOSIS — J45909 Unspecified asthma, uncomplicated: Secondary | ICD-10-CM | POA: Insufficient documentation

## 2024-07-29 DIAGNOSIS — R058 Other specified cough: Secondary | ICD-10-CM | POA: Diagnosis not present

## 2024-07-29 DIAGNOSIS — R7981 Abnormal blood-gas level: Secondary | ICD-10-CM

## 2024-07-29 DIAGNOSIS — R0602 Shortness of breath: Secondary | ICD-10-CM | POA: Diagnosis not present

## 2024-07-29 DIAGNOSIS — F17200 Nicotine dependence, unspecified, uncomplicated: Secondary | ICD-10-CM | POA: Insufficient documentation

## 2024-07-29 DIAGNOSIS — R0989 Other specified symptoms and signs involving the circulatory and respiratory systems: Secondary | ICD-10-CM

## 2024-07-29 DIAGNOSIS — I10 Essential (primary) hypertension: Secondary | ICD-10-CM | POA: Diagnosis not present

## 2024-07-29 DIAGNOSIS — J9811 Atelectasis: Secondary | ICD-10-CM | POA: Diagnosis not present

## 2024-07-29 LAB — BASIC METABOLIC PANEL WITH GFR
Anion gap: 11 (ref 5–15)
BUN: 16 mg/dL (ref 8–23)
CO2: 25 mmol/L (ref 22–32)
Calcium: 9 mg/dL (ref 8.9–10.3)
Chloride: 99 mmol/L (ref 98–111)
Creatinine, Ser: 0.63 mg/dL (ref 0.44–1.00)
GFR, Estimated: >60 mL/min (ref >60)
Glucose, Bld: 111 mg/dL — ABNORMAL HIGH (ref 70–99)
Potassium: 3.7 mmol/L (ref 3.5–5.1)
Sodium: 135 mmol/L (ref 135–145)

## 2024-07-29 LAB — CBC
HCT: 39.4 % (ref 36.0–46.0)
Hemoglobin: 13.3 g/dL (ref 12.0–15.0)
MCH: 31.3 pg (ref 26.0–34.0)
MCHC: 33.8 g/dL (ref 30.0–36.0)
MCV: 92.7 fL (ref 80.0–100.0)
Platelets: 219 K/uL (ref 150–400)
RBC: 4.25 MIL/uL (ref 3.87–5.11)
RDW: 13.9 % (ref 11.5–15.5)
WBC: 8 K/uL (ref 4.0–10.5)
nRBC: 0 % (ref 0.0–0.2)

## 2024-07-29 LAB — TROPONIN I (HIGH SENSITIVITY): Troponin I (High Sensitivity): 4 ng/L (ref <18)

## 2024-07-29 LAB — RESP PANEL BY RT-PCR (RSV, FLU A&B, COVID)  RVPGX2
Influenza A by PCR: NEGATIVE
Influenza B by PCR: NEGATIVE
Resp Syncytial Virus by PCR: NEGATIVE
SARS Coronavirus 2 by RT PCR: NEGATIVE

## 2024-07-29 LAB — BRAIN NATRIURETIC PEPTIDE: B Natriuretic Peptide: 33.2 pg/mL (ref 0.0–100.0)

## 2024-07-29 LAB — POC SOFIA SARS ANTIGEN FIA: SARS Coronavirus 2 Ag: NEGATIVE

## 2024-07-29 MED ORDER — AZITHROMYCIN 250 MG PO TABS
ORAL_TABLET | ORAL | 0 refills | Status: AC
  Filled 2024-07-29: qty 6, 5d supply, fill #0

## 2024-07-29 MED ORDER — ALBUTEROL SULFATE (2.5 MG/3ML) 0.083% IN NEBU
2.5000 mg | INHALATION_SOLUTION | Freq: Once | RESPIRATORY_TRACT | Status: AC
  Administered 2024-07-29: 2.5 mg via RESPIRATORY_TRACT
  Filled 2024-07-29: qty 3

## 2024-07-29 MED ORDER — PREDNISONE 20 MG PO TABS
40.0000 mg | ORAL_TABLET | Freq: Every day | ORAL | 0 refills | Status: AC
  Filled 2024-07-29: qty 8, 4d supply, fill #0

## 2024-07-29 MED ORDER — PREDNISONE 20 MG PO TABS
60.0000 mg | ORAL_TABLET | Freq: Once | ORAL | Status: AC
  Administered 2024-07-29: 60 mg via ORAL
  Filled 2024-07-29: qty 3

## 2024-07-29 MED ORDER — ALBUTEROL SULFATE HFA 108 (90 BASE) MCG/ACT IN AERS
2.0000 | INHALATION_SPRAY | Freq: Four times a day (QID) | RESPIRATORY_TRACT | 2 refills | Status: AC | PRN
  Filled 2024-07-29: qty 6.7, 25d supply, fill #0

## 2024-07-29 MED ORDER — IPRATROPIUM-ALBUTEROL 0.5-2.5 (3) MG/3ML IN SOLN
3.0000 mL | Freq: Once | RESPIRATORY_TRACT | Status: AC
  Administered 2024-07-29: 3 mL via RESPIRATORY_TRACT
  Filled 2024-07-29: qty 3

## 2024-07-29 NOTE — ED Triage Notes (Signed)
 Patient states that she's been sick x 5 days, cough, congestion, wheezing, just feeling bad.  Patient has taken Clindamycin  x 5 tablets, Vicks Cold & Flu and Albuterol  Inhaler.

## 2024-07-29 NOTE — Discharge Instructions (Addendum)
 Your evaluation in the emergency department was notable for wheezing but otherwise reassuring.  I suspect you have inflammation of your lungs in the setting of a recent viral infection (you tested negative for COVID, influenza, and RSV), I suspect your smoking may be contributing to this as well.  I have started you on a course of steroids and also prescribed you an albuterol  inhaler and a short course of antibiotics.  Please follow-up with your primary care provider for reevaluation, and return to the emergency department with any new or worsening symptoms.

## 2024-07-29 NOTE — ED Triage Notes (Signed)
 Pt arrives via POV with c/o SOB (not able to get a full breath) since Tuesday. Pt states they have been running a fever of 100 or so several times over the last several days. Pt states they haven't had any energy and feels wiped out. Pt is a current smoker, but hasn't had anything to smoke since Tuesday. Pt is able to answer questions in full complete sentences without difficulty. Pt is A&Ox4 and ambulatory during triage.

## 2024-07-29 NOTE — ED Notes (Signed)
 Patient is being discharged from the Urgent Care and sent to the Emergency Department via private vehicle . Per Burnard Corlis PIETY patient is in need of higher level of care due to further evaluation. Patient is aware and verbalizes understanding of plan of care.  Vitals:   07/29/24 0946 07/29/24 1012  BP: 121/69   Pulse: 71   Resp: 20   Temp: 99 F (37.2 C)   SpO2: 91% 91%

## 2024-07-29 NOTE — ED Provider Notes (Signed)
 Cheryl Shepherd    CSN: [id number] Arrival date & time: 07/29/24  9074      History   Chief Complaint Chief Complaint  Patient presents with   Cough    Coughing and congestion - Entered by patient    HPI Cheryl Shepherd is a 63 y.o. female.  Patient presents on day 5 of fever, congestion, productive cough, wheezing, fatigue.  Tmax 100.  She reports mild shortness of breath.  She has been taking OTC cold and flu medication and using an albuterol  inhaler.  She also has been taking clindamycin  that was prescribed by her dentist as a prophylactic pretreatment before dental procedures; she has taken 5 doses.  Patient reports no history of lung disease but is a long-term daily smoker.  She reports she has not been smoking since she developed her current symptoms.  The history is provided by the patient and medical records.    Past Medical History:  Diagnosis Date   Anxiety    Cancer (HCC)    skin   Eczema    Hyperlipidemia    Hypertension    Restless leg syndrome    Rheumatoid arteritis (HCC)    Sleep apnea     Patient Active Problem List   Diagnosis Date Noted   Left sided sciatica 07/05/2024   Allergies 02/01/2024   Generalized anxiety disorder 10/05/2023   Morbid obesity (HCC) 10/05/2023   Restless leg syndrome 08/03/2021   Family history of colon cancer requiring screening colonoscopy    Hypertension 02/22/2017   Sleep apnea 02/22/2017    Past Surgical History:  Procedure Laterality Date   ABLATION     CARPECTOMY     right wrist   CHOLECYSTECTOMY     COLONOSCOPY WITH PROPOFOL  N/A 02/22/2020   Procedure: COLONOSCOPY WITH PROPOFOL ;  Surgeon: Unk Corinn Skiff, MD;  Location: [facility] ENDOSCOPY;  Service: Gastroenterology;  Laterality: N/A;   EYE SURGERY     JOINT REPLACEMENT     KNEE SURGERY  06/2021   total replacement on L-knee   REPLACEMENT TOTAL KNEE Left     OB History     Gravida  2   Para  2   Term  2   Preterm      AB      Living  2       SAB      IAB      Ectopic      Multiple      Live Births               Home Medications    Prior to Admission medications   Medication Sig Start Date End Date Taking? Authorizing Provider  Ascorbic Acid (VITAMIN C) 1000 MG tablet Take 1,000 mg by mouth daily.   Yes [provider]  Cholecalciferol (VITAMIN D3) 125 MCG (5000 UT) CAPS Take 1 capsule by mouth daily.   Yes [provider]  clindamycin  (CLEOCIN ) 300 MG capsule Take 2 capsules, one hour before dental appt 02/28/24  Yes   clobetasol  ointment (TEMOVATE ) 0.05 % Apply thin film to affected rash on legs 2 times daily until clear for up to 2 weeks, then as needed for flares. 10/13/23  Yes   cyanocobalamin  (VITAMIN B12) 1000 MCG tablet Take 1,000 mcg by mouth in the morning and at bedtime.   Yes [provider]  escitalopram  (LEXAPRO ) 10 MG tablet Take 1 tablet (10 mg total) by mouth daily. 07/25/24  Yes Kayla Jeoffrey RAMAN, FNP  gabapentin  (NEURONTIN ) 300 MG capsule Take 2 capsules (600 mg total) by mouth at bedtime. 02/29/24 08/28/24 Yes Howard, Amber S, FNP  hydrochlorothiazide  (HYDRODIURIL ) 25 MG tablet Take 1 tablet (25 mg total) by mouth daily. 07/11/23  Yes   hydroxychloroquine  (PLAQUENIL ) 200 MG tablet Take 2 tablets (400 mg total) by mouth daily. 02/14/24  Yes Patel, Mayur K, MD  neomycin -polymyxin-dexamethasone  (MAXITROL ) 0.1 % ophthalmic suspension Place 1 drop into affected eye 3 (three) times daily. 06/04/24  Yes   zinc gluconate 50 MG tablet Take 50 mg by mouth daily.   Yes [provider]  clindamycin  (CLEOCIN ) 300 MG capsule Take 2 capsules (600 mg total) by mouth 1 hour before dental appointment. Patient not taking: Reported on 07/05/2024 08/03/23     Hydroxychloroquine  Sulfate 100 MG TABS Take 2 tablets by mouth daily. Patient not taking: Reported on 07/05/2024    [provider]  methocarbamol  (ROBAXIN ) 750 MG tablet Take 1 tablet (750 mg total) by mouth 3 (three) times  daily. 07/05/24   Kayla Jeoffrey RAMAN, FNP  tirzepatide  (ZEPBOUND ) 2.5 MG/0.5ML Pen Inject 2.5 mg into the skin once a week. 07/05/24   Kayla Jeoffrey RAMAN, FNP    Family History Family History  Problem Relation Age of Onset   Breast cancer Mother 69   Arthritis Mother    Allergic rhinitis Sister    Other Sister        genetic testing neg (mother of niece and nephew with cancer)   Colon cancer Brother    Colon cancer Nephew 43   Breast cancer Niece 60   Arthritis Father    Heart disease Father    Kidney disease Father    Heart disease Brother    Angioedema Neg Hx    Asthma Neg Hx    Eczema Neg Hx    Urticaria Neg Hx     Social History Social History   Tobacco Use   Smoking status: Every Day    Current packs/day: 1.00    Types: Cigarettes   Smokeless tobacco: Never   Tobacco comments:    Over 15+years  Vaping Use   Vaping status: Never Used  Substance Use Topics   Alcohol use: Yes    Comment: rarely   Drug use: No     Allergies   Cortisone, Diclofenac , Leflunomide, Methotrexate, Penicillin g, Sulfa antibiotics, Sulfacetamide, and Penicillins   Review of Systems Review of Systems  Constitutional:  Positive for chills and fever.  HENT:  Positive for congestion. Negative for ear pain and sore throat.   Respiratory:  Positive for cough, shortness of breath and wheezing.   Cardiovascular:  Negative for chest pain and palpitations.     Physical Exam Triage Vital Signs ED Triage Vitals  Encounter Vitals Group     BP 07/29/24 0946 121/69     Girls Systolic BP Percentile --      Girls Diastolic BP Percentile --      Boys Systolic BP Percentile --      Boys Diastolic BP Percentile --      Pulse Rate 07/29/24 0946 71     Resp 07/29/24 0946 20     Temp 07/29/24 0946 99 F (37.2 C)     Temp Source 07/29/24 0946 Oral     SpO2 07/29/24 0946 91 %     Weight --      Height --      Head Circumference --      Peak Flow --  Pain Score 07/29/24 0948 0     Pain Loc --       Pain Education --      Exclude from Growth Chart --    No data found.  Updated Vital Signs BP 121/69 (BP Location: Right Arm)   Pulse 71   Temp 99 F (37.2 C) (Oral)   Resp 20   SpO2 91% Comment: without a mask  Visual Acuity Right Eye Distance:   Left Eye Distance:   Bilateral Distance:    Right Eye Near:   Left Eye Near:    Bilateral Near:     Physical Exam Constitutional:      General: She is not in acute distress.    Appearance: She is ill-appearing.  HENT:     Right Ear: Tympanic membrane normal.     Left Ear: Tympanic membrane normal.     Nose: Congestion present.     Mouth/Throat:     Mouth: Mucous membranes are moist.     Pharynx: Oropharynx is clear.  Cardiovascular:     Rate and Rhythm: Normal rate and regular rhythm.     Heart sounds: Normal heart sounds.  Pulmonary:     Effort: Pulmonary effort is normal. No respiratory distress.     Breath sounds: Wheezing and rhonchi present.     Comments: Bilateral wheezing and rhonchi throughout. Neurological:     Mental Status: She is alert.      UC Treatments / Results  Labs (all labs ordered are listed, but only abnormal results are displayed) Labs Reviewed  POC SOFIA SARS ANTIGEN FIA - Normal    EKG   Radiology No results found.  Procedures Procedures (including critical care time)  Medications Ordered in UC Medications - No data to display  Initial Impression / Assessment and Plan / UC Course  I have reviewed the triage vital signs and the nursing notes.  Pertinent labs & imaging results that were available during my care of the patient were reviewed by me and considered in my medical decision making (see chart for details).    Borderline low oxygen saturation level, productive cough, abnormal lung sounds, current everyday smoker.  O2 sat 91% on room air.  Sending patient to the ED for evaluation.  She is agreeable to this and feels stable to drive herself to [facility] ED.  She declines  EMS.  Final Clinical Impressions(s) / UC Diagnoses   Final diagnoses:  Borderline low oxygen saturation level  Productive cough  Abnormal lung sounds  Current every day smoker     Discharge Instructions      Go to the emergency department for evaluation of your borderline low oxygen level, productive cough, abnormal lung sounds.     ED Prescriptions   None    PDMP not reviewed this encounter.   Corlis Burnard DEL, NP 07/29/24 1020

## 2024-07-29 NOTE — Discharge Instructions (Signed)
 Go to the emergency department for evaluation of your borderline low oxygen level, productive cough, abnormal lung sounds.

## 2024-07-29 NOTE — ED Provider Notes (Signed)
 [facility] Provider Note    Event Date/Time   First MD Initiated Contact with Patient 07/29/24 1227     (approximate)   History   Shortness of Breath  Pt arrives via POV with c/o SOB (not able to get a full breath) since Tuesday. Pt states they have been running a fever of 100 or so several times over the last several days. Pt states they haven't had any energy and feels wiped out. Pt is a current smoker, but hasn't had anything to smoke since Tuesday. Pt is able to answer questions in full complete sentences without difficulty. Pt is A&Ox4 and ambulatory during triage.    HPI Cheryl Shepherd is a 63 y.o. female PMH hypertension, sleep apnea, anxiety, obesity, hyperlipidemia, tobacco use presents for evaluation of shortness of breath - Patient has been having some cough and shortness of breath over the past week with URI symptoms.  Tmax 100 Fahrenheit at home.  Some fatigue and shortness of breath with exertion. - Went to urgent care this morning, was reportedly satting around 91%, they did not have x-ray ability and sent patient to ED for further eval.      Physical Exam   Triage Vital Signs: ED Triage Vitals  Encounter Vitals Group     BP 07/29/24 1035 (!) 143/79     Girls Systolic BP Percentile --      Girls Diastolic BP Percentile --      Boys Systolic BP Percentile --      Boys Diastolic BP Percentile --      Pulse Rate 07/29/24 1035 75     Resp 07/29/24 1035 18     Temp 07/29/24 1035 98.3 F (36.8 C)     Temp Source 07/29/24 1035 Oral     SpO2 07/29/24 1035 93 %     Weight 07/29/24 1036 190 lb (86.2 kg)     Height 07/29/24 1036 5' 2 (1.575 m)     Head Circumference --      Peak Flow --      Pain Score 07/29/24 1035 0     Pain Loc --      Pain Education --      Exclude from Growth Chart --     Most recent vital signs: Vitals:   07/29/24 1503 07/29/24 1504  BP: (!) 151/75   Pulse:  85  Resp:  11  Temp:    SpO2:  92%      General: Awake, no distress.  CV:  Good peripheral perfusion. RRR, RP 2+, lower extremity edema appreciated Resp:  Normal effort. CTAB, diffuse end expiratory wheezing throughout.  Mildly coarse breath sounds bilateral lower lung fields. Abd:  No distention. Nontender to deep palpation throughout   ED Results / Procedures / Treatments   Labs (all labs ordered are listed, but only abnormal results are displayed) Labs Reviewed  BASIC METABOLIC PANEL WITH GFR - Abnormal; Notable for the following components:      Result Value   Glucose, Bld 111 (*)    All other components within normal limits  RESP PANEL BY RT-PCR (RSV, FLU A&B, COVID)  RVPGX2  CBC  BRAIN NATRIURETIC PEPTIDE  TROPONIN I (HIGH SENSITIVITY)     EKG  See ED course below   RADIOLOGY Radiology interpreted by myself and radiology report reviewed.  No acute pathology appreciated.  Some atelectasis and possible small effusions.    PROCEDURES:  Critical Care performed: No  Procedures  MEDICATIONS ORDERED IN ED: Medications  ipratropium-albuterol  (DUONEB) 0.5-2.5 (3) MG/3ML nebulizer solution 3 mL (3 mLs Nebulization Given 07/29/24 1324)  ipratropium-albuterol  (DUONEB) 0.5-2.5 (3) MG/3ML nebulizer solution 3 mL (3 mLs Nebulization Given 07/29/24 1323)  predniSONE  (DELTASONE ) tablet 60 mg (60 mg Oral Given 07/29/24 1323)  albuterol  (PROVENTIL ) (2.5 MG/3ML) 0.083% nebulizer solution 2.5 mg (2.5 mg Nebulization Given 07/29/24 1510)     IMPRESSION / MDM / ASSESSMENT AND PLAN / ED COURSE  I reviewed the triage vital signs and the nursing notes.                              DDX/MDM/AP: Differential diagnosis includes, but is not limited to, reactive airway disease, consider underlying COPD (patient is a smoker), suspect recent viral URI though consider underlying pneumonia.  Considered but doubt heart failure, doubt ACS.  Fortunately remains afebrile here, satting 91-96% on room air with normal work of  breathing.  Plan: - Labs Chest x-ray - EKG - DuoNebs - Prednisone  -  reassess  Patient's presentation is most consistent with acute presentation with potential threat to life or bodily function.  The patient is on the cardiac monitor to evaluate for evidence of arrhythmia and/or significant heart rate changes.  ED course below.  Unremarkable, no leukocytosis.  Troponin and BNP normal.  Chest x-ray with possible cardiomegaly, bibasilar atelectasis, possible small effusions-no evidence of focal consolidation.  Symptoms significantly improved with serial DuoNebs, also loaded with prednisone .  Will treat for reactive airway disease and possible mild COPD exacerbation with steroids, albuterol  MDI, azithromycin .  COVID/flu/RSV negative, suspect other viral syndrome.  Plan for PMD follow-up.  ED return precautions in place.  Patient agrees with plan.  Clinical Course as of 07/29/24 1523  Sun Jul 29, 2024  1229 CXR:\ IMPRESSION: Enlargement of the cardiopericardial silhouette with bibasilar atelectasis and possible tiny effusions.   [MM]  1230 CBC, BMP reviewed, unremarkable Troponin normal BNP normal [MM]  1309 Ecg = sinus rhythm, rate 75, no gross ST elevation or depression, no significant repolarization abnormality, normal axis, normal intervals.  No evidence of ischemia nor arrhythmia on my interpretation. [MM]  1322 Covid/flu/rsv neg [MM]  1500 Patient feeling better after initial DuoNeb treatments and steroids.  Does have some persistent wheezing.  Ambulatory saturations 90-96%.  Is amenable to 1 more round of nebulizer treatments though prefers discharge home as opposed to admission to the hospital afterward.  Believe this is reasonable.  Will plan for discharge home with steroids, azithromycin , albuterol  inhaler and plan for close PMD follow-up.  ED return precautions in place.  Patient agrees with plan. [MM]    Clinical Course User Index [MM] Clarine Ozell LABOR, MD     FINAL  CLINICAL IMPRESSION(S) / ED DIAGNOSES   Final diagnoses:  Reactive airway disease without complication, unspecified asthma severity, unspecified whether persistent  Wheezing  Upper respiratory tract infection, unspecified type     Rx / DC Orders   ED Discharge Orders          Ordered    predniSONE  (DELTASONE ) 20 MG tablet  Daily with breakfast        07/29/24 1328    albuterol  (VENTOLIN  HFA) 108 (90 Base) MCG/ACT inhaler  Every 6 hours PRN        07/29/24 1328    azithromycin  (ZITHROMAX  Z-PAK) 250 MG tablet        07/29/24 1328  Note:  This document was prepared using Dragon voice recognition software and may include unintentional dictation errors.   Clarine Ozell LABOR, MD 07/29/24 [phone]

## 2024-07-30 ENCOUNTER — Institutional Professional Consult (permissible substitution) (INDEPENDENT_AMBULATORY_CARE_PROVIDER_SITE_OTHER): Admitting: Nurse Practitioner

## 2024-07-30 ENCOUNTER — Other Ambulatory Visit: Payer: Self-pay

## 2024-07-31 ENCOUNTER — Ambulatory Visit: Admitting: Family Medicine

## 2024-07-31 ENCOUNTER — Encounter: Payer: Self-pay | Admitting: Family Medicine

## 2024-07-31 VITALS — BP 139/83 | HR 65 | Temp 98.0°F | Ht 62.0 in | Wt 194.4 lb

## 2024-07-31 DIAGNOSIS — J069 Acute upper respiratory infection, unspecified: Secondary | ICD-10-CM | POA: Insufficient documentation

## 2024-07-31 NOTE — Progress Notes (Signed)
 Subjective:  HPI: Cheryl Shepherd is a 63 y.o. female presenting on 07/31/2024 for Hospitalization Follow-up (07/29/24 visits to the UC and ED for Respiratory airway disease and wheezing. They gave her zpac and three breathing treatments /Sick a week ago today, a croup type cough that has made her ribs sore , w/ low grade fever , mucus in the throat that has moved to the chest. /Has not smoked since last wednesday)   HPI Patient is in today for follow-up after hospital visit for reactive airway disease. Was seen in ED on 07/29/2024 for SOB for 5 days and intermittent fevers with malaise. Was treated with z-pack, prednisone , and albuterol  PRN. BMP, BNP, CBC, troponin, CXR, respiratory viral panel, EKG all normal.  Symptoms have since improved, has not had fever since Sunday. Denies current fever, chills, body aches, pleurisy. Cough is productive. She is still short of breath and wheezing, is using albuterol  q2-3 hours. Has not smoked since last Wednesday. Started prednisone  and z-pack yesterday. Her husband is now sick.  Review of Systems  All other systems reviewed and are negative.   Relevant past medical history reviewed and updated as indicated.   Past Medical History:  Diagnosis Date   Anxiety    Cancer (HCC)    skin   Eczema    Hyperlipidemia    Hypertension    Restless leg syndrome    Rheumatoid arteritis (HCC)    Sleep apnea      Past Surgical History:  Procedure Laterality Date   ABLATION     CARPECTOMY     right wrist   CHOLECYSTECTOMY     COLONOSCOPY WITH PROPOFOL  N/A 02/22/2020   Procedure: COLONOSCOPY WITH PROPOFOL ;  Surgeon: Unk Corinn Skiff, MD;  Location: [facility] ENDOSCOPY;  Service: Gastroenterology;  Laterality: N/A;   EYE SURGERY     JOINT REPLACEMENT     KNEE SURGERY  06/2021   total replacement on L-knee   REPLACEMENT TOTAL KNEE Left     Allergies and medications reviewed and updated.   Current Outpatient Medications:    albuterol  (VENTOLIN  HFA)  108 (90 Base) MCG/ACT inhaler, Inhale 2 puffs into the lungs every 6 (six) hours as needed for wheezing or shortness of breath., Disp: 6.7 g, Rfl: 2   Ascorbic Acid (VITAMIN C) 1000 MG tablet, Take 1,000 mg by mouth daily., Disp: , Rfl:    azithromycin  (ZITHROMAX  Z-PAK) 250 MG tablet, Take 2 tablets (500 mg total) by mouth daily for 1 day, THEN 1 tablet (250 mg total) daily for 4 days., Disp: 6 tablet, Rfl: 0   Cholecalciferol (VITAMIN D3) 125 MCG (5000 UT) CAPS, Take 1 capsule by mouth daily., Disp: , Rfl:    clindamycin  (CLEOCIN ) 300 MG capsule, Take 2 capsules, one hour before dental appt, Disp: 4 capsule, Rfl: 1   clobetasol  ointment (TEMOVATE ) 0.05 %, Apply thin film to affected rash on legs 2 times daily until clear for up to 2 weeks, then as needed for flares., Disp: 45 g, Rfl: 0   cyanocobalamin  (VITAMIN B12) 1000 MCG tablet, Take 1,000 mcg by mouth in the morning and at bedtime., Disp: , Rfl:    escitalopram  (LEXAPRO ) 10 MG tablet, Take 1 tablet (10 mg total) by mouth daily., Disp: 90 tablet, Rfl: 1   gabapentin  (NEURONTIN ) 300 MG capsule, Take 2 capsules (600 mg total) by mouth at bedtime., Disp: 180 capsule, Rfl: 1   hydrochlorothiazide  (HYDRODIURIL ) 25 MG tablet, Take 1 tablet (25 mg total) by mouth daily., Disp:  90 tablet, Rfl: 1   hydroxychloroquine  (PLAQUENIL ) 200 MG tablet, Take 2 tablets (400 mg total) by mouth daily., Disp: 180 tablet, Rfl: 1   neomycin -polymyxin-dexamethasone  (MAXITROL ) 0.1 % ophthalmic suspension, Place 1 drop into affected eye 3 (three) times daily., Disp: 5 mL, Rfl: 0   predniSONE  (DELTASONE ) 20 MG tablet, Take 2 tablets (40 mg total) by mouth daily with breakfast for 4 days., Disp: 8 tablet, Rfl: 0   zinc gluconate 50 MG tablet, Take 50 mg by mouth daily., Disp: , Rfl:    methocarbamol  (ROBAXIN ) 750 MG tablet, Take 1 tablet (750 mg total) by mouth 3 (three) times daily. (Patient not taking: Reported on 07/31/2024), Disp: 30 tablet, Rfl: 0  Current  Facility-Administered Medications:    betamethasone  acetate-betamethasone  sodium phosphate (CELESTONE ) injection 3 mg, 3 mg, Intramuscular, Once, Evans, Thresa HERO, DPM  Allergies  Allergen Reactions   Cortisone Swelling, Hives and Other (See Comments)    Flushing   Diclofenac  Other (See Comments)   Leflunomide Nausea Only   Methotrexate Diarrhea   Penicillin G Other (See Comments)   Sulfa Antibiotics Nausea Only and Other (See Comments)   Sulfacetamide     Other reaction(s): NAUSEA   Penicillins Rash and Other (See Comments)    Objective:   BP 139/83   Pulse 65   Temp 98 F (36.7 C)   Ht 5' 2 (1.575 m)   Wt 194 lb 6.4 oz (88.2 kg)   SpO2 93%   BMI 35.56 kg/m      07/31/2024    3:34 PM 07/29/2024    3:27 PM 07/29/2024    3:15 PM  Vitals with BMI  Height 5' 2    Weight 194 lbs 6 oz    BMI 35.55    Systolic 139 144   Diastolic 83 77   Pulse 65 86 78     Physical Exam Vitals and nursing note reviewed.  Constitutional:      Appearance: Normal appearance. She is normal weight.  HENT:     Head: Normocephalic and atraumatic.  Cardiovascular:     Rate and Rhythm: Normal rate and regular rhythm.     Pulses: Normal pulses.     Heart sounds: Normal heart sounds.  Pulmonary:     Effort: Pulmonary effort is normal.     Breath sounds: Wheezing and rhonchi present.  Skin:    General: Skin is warm and dry.  Neurological:     General: No focal deficit present.     Mental Status: She is alert and oriented to person, place, and time. Mental status is at baseline.  Psychiatric:        Mood and Affect: Mood normal.        Behavior: Behavior normal.        Thought Content: Thought content normal.        Judgment: Judgment normal.     Assessment & Plan:  Viral URI with cough Assessment & Plan: Pt started z-pack and prednisone  yesterday. Recommended Albuterol  every 4-6 hours PRN for wheezing and SOB. Continues to have adventitious lung sounds. Discussed expected course and  features suggestive of secondary bacterial infection.  Continue supportive care. Increase fluid intake with water or electrolyte solution like pedialyte. Encouraged acetaminophen as needed for fever/pain. Follow up PRN if symptoms persist or worsen      Follow up plan: Return if symptoms worsen or fail to improve.  Jeoffrey GORMAN Barrio, FNP

## 2024-07-31 NOTE — Assessment & Plan Note (Signed)
 Pt started z-pack and prednisone  yesterday. Recommended Albuterol  every 4-6 hours PRN for wheezing and SOB. Continues to have adventitious lung sounds. Discussed expected course and features suggestive of secondary bacterial infection.  Continue supportive care. Increase fluid intake with water or electrolyte solution like pedialyte. Encouraged acetaminophen as needed for fever/pain. Follow up PRN if symptoms persist or worsen

## 2024-08-17 ENCOUNTER — Other Ambulatory Visit: Payer: Self-pay

## 2024-08-17 ENCOUNTER — Other Ambulatory Visit: Payer: Self-pay | Admitting: Family Medicine

## 2024-08-18 ENCOUNTER — Other Ambulatory Visit: Payer: Self-pay | Admitting: Family Medicine

## 2024-08-18 ENCOUNTER — Other Ambulatory Visit: Payer: Self-pay

## 2024-08-20 ENCOUNTER — Ambulatory Visit (INDEPENDENT_AMBULATORY_CARE_PROVIDER_SITE_OTHER): Admitting: Nurse Practitioner

## 2024-08-20 ENCOUNTER — Other Ambulatory Visit: Payer: Self-pay

## 2024-08-20 MED FILL — Hydrochlorothiazide Tab 25 MG: ORAL | 90 days supply | Qty: 90 | Fill #0 | Status: AC

## 2024-08-20 MED FILL — Gabapentin Cap 300 MG: ORAL | 90 days supply | Qty: 180 | Fill #0 | Status: AC

## 2024-08-21 ENCOUNTER — Other Ambulatory Visit: Payer: Self-pay

## 2024-08-21 MED ORDER — HYDROXYCHLOROQUINE SULFATE 200 MG PO TABS
400.0000 mg | ORAL_TABLET | Freq: Every day | ORAL | 1 refills | Status: AC
  Filled 2024-08-21: qty 180, 90d supply, fill #0
  Filled 2024-11-17: qty 180, 90d supply, fill #1

## 2024-08-23 ENCOUNTER — Ambulatory Visit (INDEPENDENT_AMBULATORY_CARE_PROVIDER_SITE_OTHER): Admitting: Nurse Practitioner

## 2024-09-05 DIAGNOSIS — D2262 Melanocytic nevi of left upper limb, including shoulder: Secondary | ICD-10-CM | POA: Diagnosis not present

## 2024-09-05 DIAGNOSIS — D2272 Melanocytic nevi of left lower limb, including hip: Secondary | ICD-10-CM | POA: Diagnosis not present

## 2024-09-05 DIAGNOSIS — L821 Other seborrheic keratosis: Secondary | ICD-10-CM | POA: Diagnosis not present

## 2024-09-05 DIAGNOSIS — D2271 Melanocytic nevi of right lower limb, including hip: Secondary | ICD-10-CM | POA: Diagnosis not present

## 2024-09-05 DIAGNOSIS — D225 Melanocytic nevi of trunk: Secondary | ICD-10-CM | POA: Diagnosis not present

## 2024-09-05 DIAGNOSIS — D0461 Carcinoma in situ of skin of right upper limb, including shoulder: Secondary | ICD-10-CM | POA: Diagnosis not present

## 2024-09-05 DIAGNOSIS — D2261 Melanocytic nevi of right upper limb, including shoulder: Secondary | ICD-10-CM | POA: Diagnosis not present

## 2024-09-05 DIAGNOSIS — D485 Neoplasm of uncertain behavior of skin: Secondary | ICD-10-CM | POA: Diagnosis not present

## 2024-09-05 DIAGNOSIS — D045 Carcinoma in situ of skin of trunk: Secondary | ICD-10-CM | POA: Diagnosis not present

## 2024-09-05 DIAGNOSIS — D0361 Melanoma in situ of right upper limb, including shoulder: Secondary | ICD-10-CM | POA: Diagnosis not present

## 2024-09-05 NOTE — Addendum Note (Signed)
 Addended by: KAYLA JEOFFREY RAMAN on: 09/05/2024 03:47 PM   Modules accepted: Level of Service

## 2024-09-10 ENCOUNTER — Encounter (INDEPENDENT_AMBULATORY_CARE_PROVIDER_SITE_OTHER): Payer: Self-pay | Admitting: Nurse Practitioner

## 2024-09-10 ENCOUNTER — Ambulatory Visit (INDEPENDENT_AMBULATORY_CARE_PROVIDER_SITE_OTHER): Admitting: Nurse Practitioner

## 2024-09-10 VITALS — BP 119/72 | HR 75 | Temp 98.0°F | Ht 63.0 in | Wt 194.0 lb

## 2024-09-10 DIAGNOSIS — T733XXA Exhaustion due to excessive exertion, initial encounter: Secondary | ICD-10-CM | POA: Insufficient documentation

## 2024-09-10 DIAGNOSIS — R0602 Shortness of breath: Secondary | ICD-10-CM | POA: Insufficient documentation

## 2024-09-10 DIAGNOSIS — M0609 Rheumatoid arthritis without rheumatoid factor, multiple sites: Secondary | ICD-10-CM | POA: Diagnosis not present

## 2024-09-10 DIAGNOSIS — R5383 Other fatigue: Secondary | ICD-10-CM

## 2024-09-10 DIAGNOSIS — E559 Vitamin D deficiency, unspecified: Secondary | ICD-10-CM | POA: Insufficient documentation

## 2024-09-10 DIAGNOSIS — E66812 Obesity, class 2: Secondary | ICD-10-CM | POA: Diagnosis not present

## 2024-09-10 DIAGNOSIS — Z6835 Body mass index (BMI) 35.0-35.9, adult: Secondary | ICD-10-CM

## 2024-09-10 DIAGNOSIS — Z7960 Long term (current) use of unspecified immunomodulators and immunosuppressants: Secondary | ICD-10-CM | POA: Diagnosis not present

## 2024-09-10 DIAGNOSIS — E7800 Pure hypercholesterolemia, unspecified: Secondary | ICD-10-CM | POA: Diagnosis not present

## 2024-09-10 DIAGNOSIS — Z1331 Encounter for screening for depression: Secondary | ICD-10-CM

## 2024-09-10 DIAGNOSIS — R7303 Prediabetes: Secondary | ICD-10-CM

## 2024-09-10 DIAGNOSIS — G4733 Obstructive sleep apnea (adult) (pediatric): Secondary | ICD-10-CM

## 2024-09-10 DIAGNOSIS — I10 Essential (primary) hypertension: Secondary | ICD-10-CM

## 2024-09-10 DIAGNOSIS — M150 Primary generalized (osteo)arthritis: Secondary | ICD-10-CM | POA: Diagnosis not present

## 2024-09-10 DIAGNOSIS — M159 Polyosteoarthritis, unspecified: Secondary | ICD-10-CM | POA: Diagnosis not present

## 2024-09-10 NOTE — Progress Notes (Signed)
 [address] [place],  [place], [place] [address]  Office: [phone]  /  Fax: [phone]   Subjective   Initial Visit  Cheryl Shepherd (MR# [id number]) is a 63 y.o. female who presents for evaluation and treatment of obesity and related comorbidities. Current BMI is Body mass index is 34.37 kg/m. Cheryl Shepherd has been struggling with her weight for many years and has been unsuccessful in either losing weight, maintaining weight loss, or reaching her healthy weight goal.  Cheryl Shepherd is currently in the action stage of change and ready to dedicate time achieving and maintaining a healthier weight. Cheryl Shepherd is interested in becoming our patient and working on intensive lifestyle modifications including (but not limited to) diet and exercise for weight loss.  Cheryl Shepherd does have a history of hypertension that is currently well controlled on hydrochlorothiazide  25 mg every day BP Readings from Last 3 Encounters:  09/10/24 119/72  07/31/24 139/83  07/29/24 (!) 144/77   She has a history of Vitamin D deficiency and is currently on Cholecalciferol 5000 units daily She also has osteoarthritis of multiple sites  negative rheumatoid factor- she is followed by rheumatology at [facility] and is currently on Plaquenil  200 mg 2 tabs daily and gabapentin  300 mg 2 tabs at bedtime for restless legs. She has a history of pure hypercholesterolemia and is currently on no medication Cheryl Shepherd also has prediabetes with A1c 5.9 last check- no current medication  Weight history:  When asked how their weight has affected their life and health, she states: Has affected self-esteem, Having fatigue, Having poor endurance, and Has affected mood   When asked what else they would like to accomplish? She states: Adopt a healthier eating pattern and lifestyle, Improve energy levels and physical activity, Improve existing medical conditions, Improve quality of life, Improve appearance, and Improve self-confidence  She starting to note weight  gain during : pregnancy.  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   Other contributing factors: family history of obesity, disruption of circadian rhythm / sleep disordered breathing, consumption of processed foods, use of obesogenic medications: Antiepileptics, moderate to high levels of stress, chronic skipping of meals, menopause, multiple weight loss attempts in the past, and hectic pace of life.  Their highest weight has been:  220 lbs.  Desired weight: 150  Previous weight-loss programs : Weight Watchers and [employer].  Their maximum weight loss was:  40 lbs.  Their greatest challenge with dieting: difficulty maintaining reduced calorie state.  Current or previous pharmacotherapy: Metformin.  Response to medication: Had side effects so it was discontinued- made her sick   Nutritional History:  Current nutrition plan: None.  How many times do you eat outside the home: 1-2 per week  How often do they skip meals: skips breakfast and skips lunch- one or the other most days  What beverages do they drink: water, caffeinated beverages , diet soda , protein shakes , and milk.   Use of artificial sweetners : Yes  Food intolerances or dislikes: some greens and liver.  Food triggers: Boredom and Help stay awake.  Food cravings: Starches / Carbohydrates  Do they struggle with excessive hunger or portion control : Yes    Physical Activity:  Current level of physical activity: Walking 30 minutes, three a week and Strength training 10 minutes, three  Barriers to Exercise: time   Past medical history includes:   Past Medical History:  Diagnosis Date   Anxiety  Cancer (HCC)    skin   Constipation    Eczema    Gallbladder attack    History of swelling of feet    Hyperlipidemia    Hypertension    Leg swelling    Osteoarthritis    Restless leg syndrome    Rheumatoid arteritis (HCC)    Skin cancer    Sleep  apnea    Vitamin D deficiency      Objective   BP 119/72   Pulse 75   Temp 98 F (36.7 C)   Ht 5' 3 (1.6 m)   Wt 194 lb (88 kg)   SpO2 96%   BMI 34.37 kg/m  She was weighed on the bioimpedance scale: Body mass index is 34.37 kg/m.    Anthropometrics:  Vitals Temp: 98 F (36.7 C) BP: 119/72 Pulse Rate: 75 SpO2: 96 %   Anthropometric Measurements Height: 5' 3 (1.6 m) Weight: 194 lb (88 kg) BMI (Calculated): 34.37 Starting Weight: 194 lb Peak Weight: 220 lb Waist Measurement : 42 inches   Body Composition  Body Fat %: 44.7 % Fat Mass (lbs): 86.8 lbs Muscle Mass (lbs): 101.8 lbs Total Body Water (lbs): 84.8 lbs Visceral Fat Rating : 13   Other Clinical Data RMR: 1397 Fasting: yes Labs: yes Today's Visit #: 1 Starting Date: 09/10/24    Physical Exam:  General: She is overweight, cooperative, alert, well developed, and in no acute distress. PSYCH: Has normal mood, affect and thought process.   HEENT: EOMI, sclerae are anicteric. Lungs: Normal breathing effort, no conversational dyspnea. Extremities: No edema.  Neurologic: No gross sensory or motor deficits. No tremors or fasciculations noted.    Diagnostic Data Reviewed  EKG: Normal sinus rhythm, rate 75. No conduction abnormalities, abnormal Q waves or chamber enlargement. Reviewed EKG from 07/29/24 done in ER  Indirect Calorimeter completed today shows a VO2 of 203 and a REE of 1397.  Her calculated basal metabolic rate is 8503 thus her resting energy expenditure same as calculated.  Depression Screen  [name]'s PHQ-9 score was: 2.     09/10/2024   10:59 AM  Depression screen PHQ 2/9  Decreased Interest 0  Down, Depressed, Hopeless 0  PHQ - 2 Score 0  Altered sleeping 1  Tired, decreased energy 1  Change in appetite 0  Feeling bad or failure about yourself  0  Trouble concentrating 0  Moving slowly or fidgety/restless 0  Suicidal thoughts 0  PHQ-9 Score 2  Difficult doing work/chores  Not difficult at all    Screening for Sleep Related Breathing Disorders  [name] admits to daytime somnolence and admits to waking up still tired. Patient has a history of symptoms of daytime fatigue, morning fatigue, Epworth sleepiness scale, and hypertension. Daesha generally gets 5 hours of sleep per night, and states that she has nightime awakenings and difficulty falling back asleep if awakened. Snoring is present. Apneic episodes are present. Epworth Sleepiness Score is 9.  She has a CPAP and wears 100% of the time  BMET    Component Value Date/Time   NA 135 07/29/2024 1039   K 3.7 07/29/2024 1039   CL 99 07/29/2024 1039   CO2 25 07/29/2024 1039   GLUCOSE 111 (H) 07/29/2024 1039   BUN 16 07/29/2024 1039   CREATININE 0.63 07/29/2024 1039   CREATININE 0.85 01/30/2024 0834   CALCIUM  9.0 07/29/2024 1039   GFRNONAA >60 07/29/2024 1039   Lab Results  Component Value Date   HGBA1C 5.9 (H) 01/30/2024  HGBA1C 5.8 (H) 10/06/2023   No results found for: INSULIN CBC    Component Value Date/Time   WBC 8.0 07/29/2024 1039   RBC 4.25 07/29/2024 1039   HGB 13.3 07/29/2024 1039   HCT 39.4 07/29/2024 1039   PLT 219 07/29/2024 1039   MCV 92.7 07/29/2024 1039   MCH 31.3 07/29/2024 1039   MCHC 33.8 07/29/2024 1039   RDW 13.9 07/29/2024 1039   Iron/TIBC/Ferritin/ %Sat No results found for: IRON, TIBC, FERRITIN, IRONPCTSAT Lipid Panel     Component Value Date/Time   CHOL 207 (H) 01/30/2024 0834   TRIG 93 01/30/2024 0834   HDL 79 01/30/2024 0834   CHOLHDL 2.6 01/30/2024 0834   LDLCALC 109 (H) 01/30/2024 0834   Hepatic Function Panel     Component Value Date/Time   PROT 7.2 01/30/2024 0834   AST 17 01/30/2024 0834   ALT 18 01/30/2024 0834   BILITOT 0.4 01/30/2024 0834      Component Value Date/Time   TSH 3.32 10/06/2023 0808     Assessment and Plan   Class 2 severe obesity with serious comorbidity and body mass index (BMI) of 35.0 to 35.9 in adult, unspecified  obesity type TREATMENT PLAN FOR OBESITY:  Recommended Dietary Goals  Lattie is currently in the action stage of change. As such, her goal is to implement medically supervised obesity management plan.  She has agreed to implement: the Category 1 plan - 1000 kcal per day  Behavioral Intervention  We discussed the following Behavioral Modification Strategies today: increasing lean protein intake to established goals, decreasing simple carbohydrates , increasing vegetables, increasing lower glycemic fruits, increasing fiber rich foods, avoiding skipping meals, increasing water intake, work on meal planning and preparation, reading food labels , keeping healthy foods at home, identifying sources and decreasing liquid calories, decreasing eating out or consumption of processed foods, and making healthy choices when eating convenient foods, planning for success, and better snacking choices  Additional resources provided today: Handout on healthy eating and balanced plate, Handout on complex carbohydrates and lean sources of protein, Category 1 packet, and Handout principles of weight management  Recommended Physical Activity Goals  Jesslyn has been advised to work up to 150 minutes of moderate intensity aerobic activity a week and strengthening exercises 2-3 times per week for cardiovascular health, weight loss maintenance and preservation of muscle mass.   She has agreed to :  Think about enjoyable ways to increase daily physical activity and overcoming barriers to exercise, Increase physical activity in their day and reduce sedentary time (increase NEAT)., Increase volume of physical activity to a goal of 150 minutes a week, and Combine aerobic and strengthening exercises for efficiency and improved cardiometabolic health.  Medical Interventions and Pharmacotherapy We Shepherd work on building a Therapist, art and behavioral strategies. We Shepherd discuss the role of pharmacotherapy as an  adjunct at subsequent visits.   ASSOCIATED CONDITIONS ADDRESSED TODAY  Other Fatigue  Sarie does feel that her weight is causing her energy to be lower than it should be. Fatigue may be related to obesity, depression or many other causes. Labs Shepherd be ordered, and in the meanwhile, Jazsmine Shepherd focus on self care including making healthy food choices, increasing physical activity and focusing on stress reduction.  Shortness of Breath Giara notes increasing shortness of breath with physical activity and seems to be worsening over time with weight gain. She notes getting out of breath sooner with activity than she used to. This has not gotten  worse recently. Lezly denies shortness of breath at rest or orthopnea.  Prediabetes Start Category 1  meal plan, limit simple carbohydrates Decreasing body weight by 10-15% can improve glucose levels Continue exercise with current goal of 150 minutes of moderate to high intensity exercise/week.  -     Hemoglobin A1c -     Insulin, random  Obstructive sleep apnea syndrome       Use CPAP 100% of the time       Decreasing body weight by 10-15% can improve AHI  Primary hypertension Continue hydrochlorothiazide  25 mg every day Start Category 1 meal plan  and DASH diet Monitor BP and if consistently >140/90 notify PCP If develops headaches, chest pain, shortness of breath or dizziness go to ER Loss of 10-15% body weight can help improve blood pressures  -     Comprehensive metabolic panel with GFR  Pure hypercholesterolemia Focus on implementing category 1 meal plan, limit saturated fats Loss of 10-15% body weight can improve lipid levels Focus on getting 150 minutes a week of moderate to high intensity exercise  -     Lipid panel  Class 2 severe obesity with serious comorbidity and body mass index (BMI) of 35.0 to 35.9 in adult, unspecified obesity type See plan above -     Comprehensive metabolic panel with GFR -     Hemoglobin A1c -     Insulin,  random -     Lipid panel -     Magnesium -     TSH -     Vitamin B12 -     VITAMIN D 25 Hydroxy (Vit-D Deficiency, Fractures)  Depression screening Depression screen is negative  Vitamin D deficiency Currently on Vit D 5000 units daily -     VITAMIN D 25 Hydroxy (Vit-D Deficiency, Fractures)  Osteoarthritis of multiple joints, unspecified osteoarthritis type       Continue to follow with rheumatology       Continue Plaquenil  as directed   Follow-up  She was informed of the importance of frequent follow-up visits to maximize her success with intensive lifestyle modifications for her multiple health conditions. She was informed we would discuss her lab results at her next visit unless there is a critical issue that needs to be addressed sooner. Corri agreed to keep her next visit at the agreed upon time to discuss these results.  Attestation Statement  This is the patient's intake visit at [facility]. The patient's Health Questionnaire was reviewed at length. Included in the packet: current and past health history, medications, allergies, ROS, gynecologic history (women only), surgical history, family history, social history, weight history, weight loss surgery history (for those that have had weight loss surgery), nutritional evaluation, mood and food questionnaire, PHQ9, Epworth questionnaire, sleep habits questionnaire, patient life and health improvement goals questionnaire. These Shepherd all be scanned into the patient's chart under media.   During the visit, I independently reviewed the patient's EKG, previous labs, bioimpedance scale results, and indirect calorimetry results. I used this information to medically tailor a meal plan for the patient that Shepherd help her to lose weight and Shepherd improve her obesity-related conditions. I performed a medically necessary appropriate examination and/or evaluation. I discussed the assessment and treatment plan with the patient. The  patient was provided an opportunity to ask questions and all were answered. The patient agreed with the plan and demonstrated an understanding of the instructions. Labs were ordered at this visit and Shepherd be reviewed  at the next visit unless critical results need to be addressed immediately. Clinical information was updated and documented in the EMR.   In addition, they received basic education on identification of processed foods and reduction of these, different sources of lean proteins and complex carbohydrates and how to eat balanced by incorporation of whole foods.  Reviewed by clinician on day of visit: allergies, medications, problem list, medical history, surgical history, family history, social history, and previous encounter notes.  I have spent 43 minutes in the care of the patient today including: 14 minutes before the visit reviewing and preparing the chart. 23 minutes face-to-face assessing and reviewing listed medical problems as outlined in obesity care plan, providing nutritional and behavioral counseling on topics outlined in the obesity care plan, independently interpreting test results and goals of care, as described in assessment and plan, reviewing and discussing biometric information and progress, reviewing latest PCP notes and specialist consultations, managing referral , and ordering diagnostics - see orders 6 minutes after the visit updating chart and documentation of encounter.       Augustine Brannick ANP-C

## 2024-09-11 ENCOUNTER — Ambulatory Visit (INDEPENDENT_AMBULATORY_CARE_PROVIDER_SITE_OTHER): Payer: Self-pay | Admitting: Nurse Practitioner

## 2024-09-11 LAB — LIPID PANEL
Chol/HDL Ratio: 3 ratio (ref 0.0–4.4)
Cholesterol, Total: 217 mg/dL — ABNORMAL HIGH (ref 100–199)
HDL: 72 mg/dL (ref >39)
LDL Chol Calc (NIH): 129 mg/dL — ABNORMAL HIGH (ref 0–99)
Triglycerides: 94 mg/dL (ref 0–149)
VLDL Cholesterol Cal: 16 mg/dL (ref 5–40)

## 2024-09-11 LAB — COMPREHENSIVE METABOLIC PANEL WITH GFR
ALT: 15 IU/L (ref 0–32)
AST: 20 IU/L (ref 0–40)
Albumin: 4.2 g/dL (ref 3.9–4.9)
Alkaline Phosphatase: 73 IU/L (ref 49–135)
BUN/Creatinine Ratio: 21 (ref 12–28)
BUN: 16 mg/dL (ref 8–27)
Bilirubin Total: 0.3 mg/dL (ref 0.0–1.2)
CO2: 25 mmol/L (ref 20–29)
Calcium: 9.2 mg/dL (ref 8.7–10.3)
Chloride: 99 mmol/L (ref 96–106)
Creatinine, Ser: 0.75 mg/dL (ref 0.57–1.00)
Globulin, Total: 2.6 g/dL (ref 1.5–4.5)
Glucose: 74 mg/dL (ref 70–99)
Potassium: 4 mmol/L (ref 3.5–5.2)
Sodium: 140 mmol/L (ref 134–144)
Total Protein: 6.8 g/dL (ref 6.0–8.5)
eGFR: 89 mL/min/1.73 (ref >59)

## 2024-09-11 LAB — VITAMIN B12: Vitamin B-12: 984 pg/mL (ref 232–1245)

## 2024-09-11 LAB — TSH: TSH: 2.25 u[IU]/mL (ref 0.450–4.500)

## 2024-09-11 LAB — MAGNESIUM: Magnesium: 2.1 mg/dL (ref 1.6–2.3)

## 2024-09-11 LAB — HEMOGLOBIN A1C
Est. average glucose Bld gHb Est-mCnc: 117 mg/dL
Hgb A1c MFr Bld: 5.7 % — ABNORMAL HIGH (ref 4.8–5.6)

## 2024-09-11 LAB — INSULIN, RANDOM: INSULIN: 6.6 u[IU]/mL (ref 2.6–24.9)

## 2024-09-11 LAB — VITAMIN D 25 HYDROXY (VIT D DEFICIENCY, FRACTURES): Vit D, 25-Hydroxy: 71.3 ng/mL (ref 30.0–100.0)

## 2024-09-24 ENCOUNTER — Encounter (INDEPENDENT_AMBULATORY_CARE_PROVIDER_SITE_OTHER): Payer: Self-pay | Admitting: Nurse Practitioner

## 2024-09-24 ENCOUNTER — Ambulatory Visit (INDEPENDENT_AMBULATORY_CARE_PROVIDER_SITE_OTHER): Admitting: Nurse Practitioner

## 2024-09-24 VITALS — BP 127/68 | HR 92 | Temp 98.1°F | Ht 63.0 in | Wt 189.0 lb

## 2024-09-24 DIAGNOSIS — E7800 Pure hypercholesterolemia, unspecified: Secondary | ICD-10-CM

## 2024-09-24 DIAGNOSIS — G4733 Obstructive sleep apnea (adult) (pediatric): Secondary | ICD-10-CM | POA: Diagnosis not present

## 2024-09-24 DIAGNOSIS — Z6835 Body mass index (BMI) 35.0-35.9, adult: Secondary | ICD-10-CM

## 2024-09-24 DIAGNOSIS — I10 Essential (primary) hypertension: Secondary | ICD-10-CM | POA: Diagnosis not present

## 2024-09-24 DIAGNOSIS — E559 Vitamin D deficiency, unspecified: Secondary | ICD-10-CM

## 2024-09-24 DIAGNOSIS — E66812 Obesity, class 2: Secondary | ICD-10-CM

## 2024-09-24 DIAGNOSIS — R7303 Prediabetes: Secondary | ICD-10-CM | POA: Diagnosis not present

## 2024-09-24 DIAGNOSIS — K760 Fatty (change of) liver, not elsewhere classified: Secondary | ICD-10-CM | POA: Diagnosis not present

## 2024-09-24 NOTE — Progress Notes (Addendum)
 Office: [phone]  /  Fax: [phone]  WEIGHT SUMMARY AND BIOMETRICS  Weight Lost Since Last Visit: 5 lb  Weight Gained Since Last Visit: 0   Vitals Temp: 98.1 F (36.7 C) BP: 127/68 Pulse Rate: 92 SpO2: 97 %   Anthropometric Measurements Height: 5' 3 (1.6 m) Weight: 189 lb (85.7 kg) BMI (Calculated): 33.49 Weight at Last Visit: 194 lb Weight Lost Since Last Visit: 5 lb Weight Gained Since Last Visit: 0 Starting Weight: 194 lb Total Weight Loss (lbs): 5 lb (2.268 kg) Peak Weight: 220 lb Waist Measurement : 42 inches   Body Composition  Body Fat %: 42.3 % Fat Mass (lbs): 80 lbs Muscle Mass (lbs): 103.4 lbs Total Body Water (lbs): 79.8 lbs Visceral Fat Rating : 12   Other Clinical Data Fasting: no Labs: no Today's Visit #: 2 Starting Date: 09/10/24    Total Weight Loss: 5 pounds  Bio Impedance Data reviewed with patient: up 1.6 pounds of muscle, down 6.8 pounds of adipose, PBF-down from 44.7% to 42.3%, Visceral fat rating down from 13 to 12  HPI  Chief Complaint: OBESITY  Cheryl Shepherd is here to discuss her progress with her obesity treatment plan. She is on the the Category 1 Plan and states she is following her eating plan approximately 50 % of the time. She states she is exercising 60 minutes 1 days per week at [facility].   Interval History:  Since last office visit she has had a lot going on the past 2 weeks. Her son got married. She is very overwhelmed with the number of 1000 calories. She is eating lunch and dinner with a snack, has not been eating breakfast because she is never hungry in the morning. She would rather focus on smart choices, portion control. She is getting 80 grams of protein and 64 ounces of water daily.  She has been going to the [facility] 1 day a week but wants to get back to three days a week.   Labs are reviewed with patient in detail. Kidney functions, liver enzymes, glucose, electrolytes, insulin, thyroid , blood count, Vit D, Vit B12 are  all in normal range.  Lipid panel revealed pure hypercholesterolemia- currently on no medication. A1c is in prediabetic range- currently on no medication  Cancer Screenings reviewed with patient as obesity is a risk factor for certain cancers Pap: 02/09/2024 Negative Mammo: 01/31/24 Negative Colonoscopy: 02/22/2020 due 02/21/2025 She does smoke daily - 45-50 pack years(quit when pregnant). Lung Cancer CT scan- has refused from PCP.    ASCVD Risk reviewed with patient The 10-year ASCVD risk score (Arnett DK, et al., 2019) is: 17.1%- intermediate risk   Values used to calculate the score:     Age: 63 years     Clincally relevant sex: Female     Is Non-Hispanic African American: No     Diabetic: Yes     Tobacco smoker: Yes     Systolic Blood Pressure: 119 mmHg     Is BP treated: Yes     HDL Cholesterol: 72 mg/dL     Total Cholesterol: 217 mg/dL   PHYSICAL EXAM:  Blood pressure 127/68, pulse 92, temperature 98.1 F (36.7 C), height 5' 3 (1.6 m), weight 189 lb (85.7 kg), SpO2 97%. Body mass index is 33.48 kg/m.  General: Well Developed, well nourished, and in no acute distress.  HEENT: Normocephalic, atraumatic; EOMI, sclerae are anicteric. Skin: Warm and dry, good turgor Chest:  Normal excursion, shape, no gross ABN Respiratory: No conversational dyspnea;  speaking in full sentences NeuroM-Sk:  Normal gross ROM * 4 extremities  Psych: A and O X 3, insight adequate, mood- full    DIAGNOSTIC DATA REVIEWED:  Last metabolic panel Lab Results  Component Value Date   GLUCOSE 74 09/10/2024   NA 140 09/10/2024   K 4.0 09/10/2024   CL 99 09/10/2024   CO2 25 09/10/2024   BUN 16 09/10/2024   CREATININE 0.75 09/10/2024   EGFR 89 09/10/2024   CALCIUM  9.2 09/10/2024   PROT 6.8 09/10/2024   ALBUMIN 4.2 09/10/2024   LABGLOB 2.6 09/10/2024   BILITOT 0.3 09/10/2024   ALKPHOS 73 09/10/2024   AST 20 09/10/2024   ALT 15 09/10/2024   ANIONGAP 11 07/29/2024  Magnesium 2.1 on  09/10/24   Lab Results  Component Value Date   HGBA1C 5.7 (H) 09/10/2024   HGBA1C 5.8 (H) 10/06/2023   Lab Results  Component Value Date   INSULIN 6.6 09/10/2024   Lab Results  Component Value Date   TSH 2.250 09/10/2024   CBC    Component Value Date/Time   WBC 8.0 07/29/2024 1039   RBC 4.25 07/29/2024 1039   HGB 13.3 07/29/2024 1039   HCT 39.4 07/29/2024 1039   PLT 219 07/29/2024 1039   MCV 92.7 07/29/2024 1039   MCH 31.3 07/29/2024 1039   MCHC 33.8 07/29/2024 1039   RDW 13.9 07/29/2024 1039    Lipid Panel     Component Value Date/Time   CHOL 217 (H) 09/10/2024 1155   TRIG 94 09/10/2024 1155   HDL 72 09/10/2024 1155   CHOLHDL 3.0 09/10/2024 1155   CHOLHDL 2.6 01/30/2024 0834   LDLCALC 129 (H) 09/10/2024 1155   LDLCALC 109 (H) 01/30/2024 0834    Nutritional Lab Results  Component Value Date   VD25OH 71.3 09/10/2024   Lab Results  Component Value Date   VITAMINB12 984 09/10/2024     ASSESSMENT AND PLAN   Class 2 severe obesity with serious comorbidity and body mass index (BMI) of 35.0 to 35.9 in adult, unspecified obesity type TREATMENT PLAN FOR OBESITY:  Recommended Dietary Goals  Cheryl Shepherd is currently in the action stage of change. As such, her goal is to continue weight management plan. She has agreed to practicing portion control and making smarter food choices, such as increasing vegetables and decreasing simple carbohydrates.  Behavioral Intervention  We discussed the following Behavioral Modification Strategies today: increasing lean protein intake to established goals, increasing vegetables, increasing fiber rich foods, avoiding skipping meals, increasing water intake , and increase protein intake, fibrous foods (25 grams per day for women, 30 grams for men) and water to improve satiety and decrease hunger signals. .   Recommended Physical Activity Goals  Cheryl Shepherd has been advised to work up to 150 minutes of moderate intensity aerobic activity a  week and strengthening exercises 2-3 times per week for cardiovascular health, weight loss maintenance and preservation of muscle mass.   She has agreed to Think about enjoyable ways to increase daily physical activity and overcoming barriers to exercise, Increase physical activity in their day and reduce sedentary time (increase NEAT)., and Increase the intensity, frequency or duration of aerobic exercises     Pharmacotherapy We discussed various medication options to help Jianni with her weight loss efforts and we both agreed to continue nutrition and exercise.  ASSOCIATED CONDITIONS ADDRESSED TODAY  Action/Plan  Prediabetes Will continue with smart choices/portion control meal plan, limit simple carbohydrates Decreasing body weight by 10-15% can improve glucose  levels Continue exercise with current goal of 150 minutes of moderate to high intensity exercise/week.   Obstructive sleep apnea syndrome     Continue use of CPAP with goal of 100%      Loss of 10-15% body weight can help improve OSA   Primary hypertension Continue hydrochlorothiazide  25 mg QD every day Continue smart choices/portion control meal plan and DASH diet Monitor BP and if consistently >140/90 notify PCP If develops headaches, chest pain, shortness of breath or dizziness go to ER Loss of 10-15% body weight can help improve blood pressures   Pure hypercholesterolemia Continue smart choice/portion control meal plan, limit saturated fats Loss of 10-15% body weight can improve lipid levels Focus on getting 150 minutes a week of moderate to high intensity exercise   Vitamin D deficiency       Continue Vit D 3 5000 units daily, Vit D is at goal  Metabolic dysfunction-associated steatotic liver disease (MASLD) heaptic steatosis on Abdominal CT 2017 Will continue with smart choices/portion control meal plan, limit simple carbohydrates Decreasing body weight by 10-15% can improve hepatic steatosis Continue exercise  with current goal of 150 minutes of moderate to high intensity exercise/week.          Return in about 4 weeks (around 10/22/2024). She was informed of the importance of frequent follow up visits to maximize her success with intensive lifestyle modifications for her multiple health conditions.   ATTESTASTION STATEMENTS:  Reviewed by clinician on day of visit: allergies, medications, problem list, medical history, surgical history, family history, social history, and previous encounter notes.   I personally spent a total of 40 minutes in the care of the patient today including preparing to see the patient, getting/reviewing separately obtained history, performing a medically appropriate exam/evaluation, counseling and educating, documenting clinical information in the EHR, communicating results, and coordinating care.   Azzan Butler ANP-C

## 2024-09-27 ENCOUNTER — Encounter: Payer: Self-pay | Admitting: Family Medicine

## 2024-09-28 ENCOUNTER — Encounter: Payer: Self-pay | Admitting: Family Medicine

## 2024-09-28 DIAGNOSIS — D0361 Melanoma in situ of right upper limb, including shoulder: Secondary | ICD-10-CM | POA: Diagnosis not present

## 2024-10-02 ENCOUNTER — Telehealth (INDEPENDENT_AMBULATORY_CARE_PROVIDER_SITE_OTHER): Payer: Self-pay | Admitting: Nurse Practitioner

## 2024-10-02 NOTE — Telephone Encounter (Signed)
 Good afternoon!  Patient received a charge for an ECG and says she did not have an EKG done at her NP appt on 10/06.   Spoke to Alix and she requested a telephone encounter be placed to prompt us  to follow up with billing.   Thanks!

## 2024-10-05 ENCOUNTER — Other Ambulatory Visit

## 2024-10-05 DIAGNOSIS — Z0000 Encounter for general adult medical examination without abnormal findings: Secondary | ICD-10-CM

## 2024-10-08 ENCOUNTER — Encounter: Payer: Self-pay | Admitting: Family Medicine

## 2024-10-08 ENCOUNTER — Ambulatory Visit: Admitting: Family Medicine

## 2024-10-08 VITALS — BP 122/82 | HR 64 | Temp 98.2°F | Ht 63.0 in | Wt 189.6 lb

## 2024-10-08 DIAGNOSIS — G2581 Restless legs syndrome: Secondary | ICD-10-CM | POA: Diagnosis not present

## 2024-10-08 DIAGNOSIS — F17210 Nicotine dependence, cigarettes, uncomplicated: Secondary | ICD-10-CM | POA: Insufficient documentation

## 2024-10-08 DIAGNOSIS — I10 Essential (primary) hypertension: Secondary | ICD-10-CM

## 2024-10-08 DIAGNOSIS — Z0000 Encounter for general adult medical examination without abnormal findings: Secondary | ICD-10-CM | POA: Insufficient documentation

## 2024-10-08 DIAGNOSIS — G4733 Obstructive sleep apnea (adult) (pediatric): Secondary | ICD-10-CM | POA: Diagnosis not present

## 2024-10-08 DIAGNOSIS — Z0001 Encounter for general adult medical examination with abnormal findings: Secondary | ICD-10-CM

## 2024-10-08 DIAGNOSIS — E7800 Pure hypercholesterolemia, unspecified: Secondary | ICD-10-CM | POA: Insufficient documentation

## 2024-10-08 DIAGNOSIS — F411 Generalized anxiety disorder: Secondary | ICD-10-CM

## 2024-10-08 NOTE — Progress Notes (Signed)
 Complete physical exam  Patient: Cheryl Shepherd   DOB: 1961-02-23   63 y.o. Female  MRN: [id number]  Subjective:    Chief Complaint  Patient presents with   Annual Exam    CPE     Cheryl Shepherd is a 62 y.o. female who presents today for a complete physical exam. She reports consuming a diabetic, low calorie diet. Home exercise routine includes biking. She generally feels well. She reports sleeping well. She does not have additional problems to discuss today.   PMH includes HTN, OSA, depression, anxiety, RLS, obesity, HLD.  HTN: on hydrochlorothiazide  25mg  daily  RLS: on Gabapentin   HLD: not medicated Lipid Panel     Component Value Date/Time   CHOL 217 (H) 09/10/2024 1155   TRIG 94 09/10/2024 1155   HDL 72 09/10/2024 1155   CHOLHDL 3.0 09/10/2024 1155   CHOLHDL 2.6 01/30/2024 0834   LDLCALC 129 (H) 09/10/2024 1155   LDLCALC 109 (H) 01/30/2024 0834   LABVLDL 16 09/10/2024 1155  The 10-year ASCVD risk score (Arnett DK, et al., 2019) is: 10.5%   Values used to calculate the score:     Age: 25 years     Clincally relevant sex: Female     Is Non-Hispanic African American: No     Diabetic: No     Tobacco smoker: Yes     Systolic Blood Pressure: 122 mmHg     Is BP treated: Yes     HDL Cholesterol: 72 mg/dL     Total Cholesterol: 217 mg/dL  Anxiety and depression: well controlled on Lexapro  10mg  daily  GERD: diet controlled  OSA on CPAP  Discussed the use of AI scribe software for clinical note transcription with the patient, who gave verbal consent to proceed.  History of Present Illness Cheryl Shepherd is a 63 year old female who presents for a follow-up on weight management and cholesterol levels.  She is focused on managing her weight and cholesterol levels. Her recent labs indicated elevated cholesterol levels. Initially, she found it challenging to adhere to a 1000 calorie per day diet but has since shifted to making healthier food choices, such as increasing  her intake of vegetables and fruits. She has lost five pounds despite attending events with food temptations. She is not strictly following a 1000 calorie diet but is making efforts to improve her overall diet.  She is on several medications including Lexapro , which is effective for her. She takes Plaquenil  for inflammation and gabapentin  for restless leg syndrome, which she finds helpful. She uses hydrochlorothiazide  daily, with her blood pressure recorded at 122/82 mmHg. Clindamycin  is used prophylactically for dental procedures due to a knee replacement. Clobetasol  ointment is used as needed for itching. She also takes vitamin C, vitamin D, and B12 supplements.  She has a history of prediabetes, with a recent drop in her A1c. She has never been in the diabetes range, only prediabetic. She is actively managing her weight and cholesterol and has started a natural supplement for cholesterol, though she could not recall the name.  She has a history of skin cancer, with a recent melanoma removal from her arm. She is scheduled to have stitches removed soon and will undergo further procedures for squamous cell carcinoma on her back and another site, as insurance allows only one procedure at a time.  She uses a CPAP machine and her restless leg syndrome is well-managed with gabapentin . No chest pain, shortness of breath, wheezing, or gastrointestinal issues.  She is physically active, attending the [facility] and using an indoor stationary bike for exercise.  She continues to smoke but wants to quit.    Most recent fall risk assessment:    10/08/2024    2:04 PM  Fall Risk   Falls in the past year? 0  Number falls in past yr: 0  Injury with Fall? 0  Risk for fall due to : No Fall Risks  Follow up Falls evaluation completed     Most recent depression screenings:    10/08/2024    2:05 PM 09/10/2024   10:59 AM  PHQ 2/9 Scores  PHQ - 2 Score 0 0  PHQ- 9 Score 0 2    Vision:Within last year and  Dental: No current dental problems  Patient Active Problem List   Diagnosis Date Noted   Pure hypercholesterolemia 10/08/2024   Physical exam, annual 10/08/2024   Cigarette nicotine dependence without complication 10/08/2024   Vitamin D deficiency 09/10/2024   Allergies 02/01/2024   Generalized anxiety disorder 10/05/2023   Morbid obesity (HCC) 10/05/2023   Restless leg syndrome 08/03/2021   Family history of colon cancer requiring screening colonoscopy    Hypertension 02/22/2017   Sleep apnea 02/22/2017   Past Medical History:  Diagnosis Date   Anxiety    Cancer (HCC)    skin   Constipation    Eczema    Gallbladder attack    History of swelling of feet    Hyperlipidemia    Hypertension    Leg swelling    Osteoarthritis    Restless leg syndrome    Rheumatoid arteritis (HCC)    Skin cancer    Sleep apnea    Vitamin D deficiency    Past Surgical History:  Procedure Laterality Date   ABLATION     CARPECTOMY     right wrist   CHOLECYSTECTOMY     COLONOSCOPY WITH PROPOFOL  N/A 02/22/2020   Procedure: COLONOSCOPY WITH PROPOFOL ;  Surgeon: Unk Corinn Skiff, MD;  Location: [facility] ENDOSCOPY;  Service: Gastroenterology;  Laterality: N/A;   EYE SURGERY  2023   eyelid reduction   JOINT REPLACEMENT     KNEE SURGERY  06/2021   total replacement on L-knee   REPLACEMENT TOTAL KNEE Left    Social History   Tobacco Use   Smoking status: Every Day    Current packs/day: 1.00    Average packs/day: 1 pack/day for 50.8 years (50.8 ttl pk-yrs)    Types: Cigarettes    Start date: 1975   Smokeless tobacco: Never   Tobacco comments:    Over 15+years  Vaping Use   Vaping status: Never Used  Substance Use Topics   Alcohol use: Yes    Comment: rarely   Drug use: No   Family History  Problem Relation Age of Onset   Depression Mother    Cancer Mother    Breast cancer Mother 29   Arthritis Mother    Hyperlipidemia Father    Hypertension Father    Arthritis Father    Heart  disease Father    Kidney disease Father    Allergic rhinitis Sister    Other Sister        genetic testing neg (mother of niece and nephew with cancer)   Colon cancer Brother    Heart disease Brother    Colon cancer Nephew 43   Breast cancer Niece 27   Angioedema Neg Hx    Asthma Neg Hx    Eczema Neg Hx  Urticaria Neg Hx    Allergies  Allergen Reactions   Cortisone Swelling, Hives and Other (See Comments)    Flushing   Diclofenac  Other (See Comments)   Leflunomide Nausea Only   Methotrexate Diarrhea   Penicillin G Other (See Comments)   Sulfa Antibiotics Nausea Only and Other (See Comments)   Sulfacetamide     Other reaction(s): NAUSEA   Penicillins Rash and Other (See Comments)      Patient Care Team: Kayla Jeoffrey RAMAN, FNP as PCP - General (Family Medicine)   Outpatient Medications Prior to Visit  Medication Sig   albuterol  (VENTOLIN  HFA) 108 (90 Base) MCG/ACT inhaler Inhale 2 puffs into the lungs every 6 (six) hours as needed for wheezing or shortness of breath.   Ascorbic Acid (VITAMIN C) 1000 MG tablet Take 1,000 mg by mouth daily.   Cholecalciferol (VITAMIN D3) 125 MCG (5000 UT) CAPS Take 1 capsule by mouth daily.   clindamycin  (CLEOCIN ) 300 MG capsule Take 2 capsules, one hour before dental appt   cyanocobalamin  (VITAMIN B12) 1000 MCG tablet Take 1,000 mcg by mouth in the morning and at bedtime.   escitalopram  (LEXAPRO ) 10 MG tablet Take 1 tablet (10 mg total) by mouth daily.   gabapentin  (NEURONTIN ) 300 MG capsule Take 2 capsules (600 mg total) by mouth at bedtime.   hydrochlorothiazide  (HYDRODIURIL ) 25 MG tablet Take 1 tablet (25 mg total) by mouth daily.   hydroxychloroquine  (PLAQUENIL ) 200 MG tablet Take 2 tablets (400 mg total) by mouth daily.   neomycin -polymyxin-dexamethasone  (MAXITROL ) 0.1 % ophthalmic suspension Place 1 drop into affected eye 3 (three) times daily.   zinc gluconate 50 MG tablet Take 50 mg by mouth daily.   [DISCONTINUED] clobetasol   ointment (TEMOVATE ) 0.05 % Apply thin film to affected rash on legs 2 times daily until clear for up to 2 weeks, then as needed for flares.   [DISCONTINUED] famotidine (PEPCID) 20 MG tablet TAKE 1 TABLET BY MOUTH AT BEDTIME AS NEEDED TWICE A DAY; Duration: 30 (Patient not taking: Reported on 10/08/2024)   [DISCONTINUED] methocarbamol  (ROBAXIN ) 750 MG tablet Take 1 tablet (750 mg total) by mouth 3 (three) times daily. (Patient not taking: Reported on 10/08/2024)   No facility-administered medications prior to visit.    Review of Systems  Constitutional: Negative.   HENT: Negative.    Eyes: Negative.   Respiratory: Negative.    Cardiovascular: Negative.   Gastrointestinal: Negative.   Genitourinary: Negative.   Musculoskeletal: Negative.   Skin: Negative.   Neurological: Negative.   Endo/Heme/Allergies: Negative.   Psychiatric/Behavioral: Negative.    All other systems reviewed and are negative.         Objective:     BP 122/82   Pulse 64   Temp 98.2 F (36.8 C)   Ht 5' 3 (1.6 m)   Wt 189 lb 9.6 oz (86 kg)   LMP  (Exact Date)   SpO2 98%   BMI 33.59 kg/m  BP Readings from Last 3 Encounters:  10/08/24 122/82  09/24/24 127/68  09/10/24 119/72   Wt Readings from Last 3 Encounters:  10/08/24 189 lb 9.6 oz (86 kg)  09/24/24 189 lb (85.7 kg)  09/10/24 194 lb (88 kg)      Physical Exam Vitals and nursing note reviewed.  Constitutional:      Appearance: Normal appearance. She is obese.  HENT:     Head: Normocephalic and atraumatic.     Right Ear: Tympanic membrane, ear canal and external ear normal.  Left Ear: Tympanic membrane, ear canal and external ear normal.     Nose: Nose normal.     Mouth/Throat:     Mouth: Mucous membranes are moist.     Pharynx: Oropharynx is clear.  Eyes:     Extraocular Movements: Extraocular movements intact.     Conjunctiva/sclera: Conjunctivae normal.     Pupils: Pupils are equal, round, and reactive to light.  Cardiovascular:      Rate and Rhythm: Normal rate and regular rhythm.     Pulses: Normal pulses.     Heart sounds: Normal heart sounds.  Pulmonary:     Effort: Pulmonary effort is normal.     Breath sounds: Normal breath sounds.  Abdominal:     General: Bowel sounds are normal.     Palpations: Abdomen is soft.  Musculoskeletal:        General: Normal range of motion.     Cervical back: Normal range of motion and neck supple.  Skin:    General: Skin is warm and dry.     Capillary Refill: Capillary refill takes less than 2 seconds.  Neurological:     General: No focal deficit present.     Mental Status: She is alert and oriented to person, place, and time. Mental status is at baseline.  Psychiatric:        Mood and Affect: Mood normal.        Behavior: Behavior normal.        Thought Content: Thought content normal.        Judgment: Judgment normal.      No results found for any visits on 10/08/24. Last CBC Lab Results  Component Value Date   WBC 8.0 07/29/2024   HGB 13.3 07/29/2024   HCT 39.4 07/29/2024   MCV 92.7 07/29/2024   MCH 31.3 07/29/2024   RDW 13.9 07/29/2024   PLT 219 07/29/2024   Last metabolic panel Lab Results  Component Value Date   GLUCOSE 74 09/10/2024   NA 140 09/10/2024   K 4.0 09/10/2024   CL 99 09/10/2024   CO2 25 09/10/2024   BUN 16 09/10/2024   CREATININE 0.75 09/10/2024   EGFR 89 09/10/2024   CALCIUM  9.2 09/10/2024   PROT 6.8 09/10/2024   ALBUMIN 4.2 09/10/2024   LABGLOB 2.6 09/10/2024   BILITOT 0.3 09/10/2024   ALKPHOS 73 09/10/2024   AST 20 09/10/2024   ALT 15 09/10/2024   ANIONGAP 11 07/29/2024   Last lipids Lab Results  Component Value Date   CHOL 217 (H) 09/10/2024   HDL 72 09/10/2024   LDLCALC 129 (H) 09/10/2024   TRIG 94 09/10/2024   CHOLHDL 3.0 09/10/2024   Last hemoglobin A1c Lab Results  Component Value Date   HGBA1C 5.7 (H) 09/10/2024   Last thyroid  functions Lab Results  Component Value Date   TSH 2.250 09/10/2024    T4TOTAL 8.2 10/06/2023   Last vitamin D Lab Results  Component Value Date   VD25OH 71.3 09/10/2024   Last vitamin B12 and Folate Lab Results  Component Value Date   VITAMINB12 984 09/10/2024        Assessment & Plan:    Routine Health Maintenance and Physical Exam  Assessment and Plan Assessment & Plan Hyperlipidemia Cholesterol levels elevated with 19.4% 10-year risk of stroke or heart attack. Prefers lifestyle modifications over medication. - Continue lifestyle modifications including diet and exercise. Counseled on risks. - Monitor cholesterol levels regularly. - Follow up for repeat labs in 3 months.  Working with [facility]  Prediabetes A1c levels decreased, indicating improvement. Managing diet and exercise effectively. - Continue current diet and exercise regimen. - Monitor A1c levels regularly.  Obesity Actively managing weight through diet and exercise. Weight loss achieved. - Continue current weight management plan with focus on healthy eating and regular exercise. - Encouraged moderation in food intake, especially during holidays.  Hypertension Blood pressure well-controlled with hydrochlorothiazide . - Continue hydrochlorothiazide  as prescribed.  Restless legs syndrome Gabapentin  effective in managing symptoms. - Continue gabapentin  as prescribed.  Tobacco use disorder Considering quitting smoking. Open to using Wellbutrin for cessation. - Discussed Wellbutrin as a potential aid for smoking cessation. - Refused lung CT to assess for any smoking-related changes.  Depression Lexapro  effective in managing symptoms. - Continue Lexapro  as prescribed. - Consider switching to Wellbutrin for sexual side effects  History of melanoma, status post excision (right arm) Melanoma excised. Stitches scheduled for removal.  Squamous cell carcinoma of skin, post-biopsy Biopsy confirmed squamous cell carcinoma. Further excisions planned. - Will schedule follow-up excisions  in two weeks.  General Health Maintenance Up to date with eye exams. Due for shingles vaccine. - Recommend Shingrix vaccine. - Continue regular eye exams every six months due to Plaquenil  use.    Immunization History  Administered Date(s) Administered   Moderna Sars-Covid-2 Vaccination 10/06/2020, 11/03/2020   PNEUMOCOCCAL CONJUGATE-20 10/05/2023    Health Maintenance  Topic Date Due   COVID-19 Vaccine (3 - Moderna risk series) 10/24/2024 (Originally 12/01/2020)   Zoster Vaccines- Shingrix (1 of 2) 01/08/2025 (Originally 10/03/1979)   Influenza Vaccine  03/05/2025 (Originally 07/06/2024)   Lung Cancer Screening  10/08/2025 (Originally 10/02/2010)   DTaP/Tdap/Td (1 - Tdap) 10/08/2025 (Originally 10/03/1979)   Colonoscopy  02/21/2025   Mammogram  01/30/2026   Cervical Cancer Screening (HPV/Pap Cotest)  02/08/2029   Pneumococcal Vaccine: 50+ Years  Completed   Hepatitis C Screening  Completed   HIV Screening  Completed   Hepatitis B Vaccines 19-59 Average Risk  Aged Out   HPV VACCINES  Aged Out   Meningococcal B Vaccine  Aged Out    Discussed health benefits of physical activity, and encouraged her to engage in regular exercise appropriate for her age and condition.  Problem List Items Addressed This Visit     Hypertension   Sleep apnea   Restless leg syndrome   Generalized anxiety disorder   Morbid obesity (HCC)   Pure hypercholesterolemia   Physical exam, annual - Primary   Cigarette nicotine dependence without complication   Return in about 3 months (around 01/08/2025) for chronic follow-up with labs 1 week prior.     Jeoffrey GORMAN Barrio, FNP

## 2024-10-17 DIAGNOSIS — D045 Carcinoma in situ of skin of trunk: Secondary | ICD-10-CM | POA: Diagnosis not present

## 2024-10-22 ENCOUNTER — Ambulatory Visit (INDEPENDENT_AMBULATORY_CARE_PROVIDER_SITE_OTHER): Payer: Self-pay | Admitting: Nurse Practitioner

## 2024-10-24 ENCOUNTER — Other Ambulatory Visit: Payer: Self-pay

## 2024-10-31 DIAGNOSIS — D0461 Carcinoma in situ of skin of right upper limb, including shoulder: Secondary | ICD-10-CM | POA: Diagnosis not present

## 2024-11-07 ENCOUNTER — Ambulatory Visit (INDEPENDENT_AMBULATORY_CARE_PROVIDER_SITE_OTHER): Admitting: Nurse Practitioner

## 2024-11-07 ENCOUNTER — Encounter (INDEPENDENT_AMBULATORY_CARE_PROVIDER_SITE_OTHER): Payer: Self-pay | Admitting: Nurse Practitioner

## 2024-11-07 VITALS — BP 126/73 | HR 69 | Temp 98.7°F | Ht 63.0 in | Wt 187.0 lb

## 2024-11-07 DIAGNOSIS — E7800 Pure hypercholesterolemia, unspecified: Secondary | ICD-10-CM | POA: Diagnosis not present

## 2024-11-07 DIAGNOSIS — I10 Essential (primary) hypertension: Secondary | ICD-10-CM | POA: Diagnosis not present

## 2024-11-07 DIAGNOSIS — Z6833 Body mass index (BMI) 33.0-33.9, adult: Secondary | ICD-10-CM

## 2024-11-07 DIAGNOSIS — G4733 Obstructive sleep apnea (adult) (pediatric): Secondary | ICD-10-CM | POA: Diagnosis not present

## 2024-11-07 DIAGNOSIS — E66811 Obesity, class 1: Secondary | ICD-10-CM

## 2024-11-07 DIAGNOSIS — E559 Vitamin D deficiency, unspecified: Secondary | ICD-10-CM | POA: Diagnosis not present

## 2024-11-07 DIAGNOSIS — M159 Polyosteoarthritis, unspecified: Secondary | ICD-10-CM | POA: Diagnosis not present

## 2024-11-07 NOTE — Progress Notes (Signed)
 Office: [phone]  /  Fax: [phone]  WEIGHT SUMMARY AND BIOMETRICS  Weight Lost Since Last Visit: 2 lb  Weight Gained Since Last Visit: 0   Vitals Temp: 98.7 F (37.1 C) BP: 126/73 Pulse Rate: 69 SpO2: 97 %   Anthropometric Measurements Height: 5' 3 (1.6 m) Weight: 187 lb (84.8 kg) BMI (Calculated): 33.13 Weight at Last Visit: 189 lb Weight Lost Since Last Visit: 2 lb Weight Gained Since Last Visit: 0 Starting Weight: 194 lb Total Weight Loss (lbs): 7 lb (3.175 kg) Peak Weight: 220 lb Waist Measurement : 42 inches   Body Composition  Body Fat %: 43.2 % Fat Mass (lbs): 81.2 lbs Muscle Mass (lbs): 101.2 lbs Total Body Water (lbs): 82.6 lbs Visceral Fat Rating : 12   Other Clinical Data Fasting: no Labs: no Today's Visit #: 3 Starting Date: 09/10/24    Total Weight Loss: 7 pounds  Bio Impedance Data reviewed with patient: muscle is down 2.2 pounds and adipose is up 1.2 pounds, Water is up 2.8 pounds.   HPI  Chief Complaint: OBESITY  Cheryl Shepherd is here to discuss her progress with her obesity treatment plan. She is on the the Category 1 Plan and states she is following her eating plan approximately 85 % of the time. She states she is exercising, [facility]    [facility] History:  Since last office visit she had Thanksgiving, and her anniversary.  Making smarter choices and portion control.  She is doing good getting her protein and fruit daily, working on getting more vegetables and water.  She feels like she will have cravings for sweets.   Going to father in laws house for Christmas, mother in law passed away in 05/02/2024-   [facility] but she wants to go very early. She has also been doing arm weights. Limited because she had removal of melanoma on right arm 3 weeks ago . Then removal of Squamous cell on right forearm 2 weeks ago, then removed squamous cell from back 2  weeks later.  She needs to limit arm exercises until all areas are healed.   Cheryl Shepherd does have hypertension and is currently on hydrochlorothiazide  25 mg every day and BP's are well controlled. She denies headaches, chest pain and shortness of breath at rest.  BP Readings from Last 3 Encounters:  11/07/24 126/73  10/08/24 122/82  09/24/24 127/68   She also has osteoarthritis of multiple sites- she is followed by rheumatology at [facility] and is currently on Plaquenil  200 mg 2 tabs daily and gabapentin  300 mg 2 tabs at bedtime for restless legs.   Sleep apnea- CPAP, uses 100% of the time.   PHYSICAL EXAM:  Blood pressure 126/73, pulse 69, temperature 98.7 F (37.1 C), height 5' 3 (1.6 m), weight 187 lb (84.8 kg), SpO2 97%. Body mass index is 33.13 kg/m.  General: Well Developed, well nourished, and in no acute distress.  HEENT: Normocephalic, atraumatic; EOMI, sclerae are anicteric. Skin: Warm and dry, good turgor Chest:  Normal excursion, shape, no gross ABN Respiratory: No conversational dyspnea; speaking in full sentences NeuroM-Sk:  Normal gross ROM * 4 extremities  Psych: A and O X 3, insight adequate, mood- full    DIAGNOSTIC DATA REVIEWED:  BMET    Component Value Date/Time   NA 140 09/10/2024 1155   K 4.0 09/10/2024 1155   CL 99 09/10/2024 1155  CO2 25 09/10/2024 1155   GLUCOSE 74 09/10/2024 1155   GLUCOSE 111 (H) 07/29/2024 1039   BUN 16 09/10/2024 1155   CREATININE 0.75 09/10/2024 1155   CREATININE 0.85 01/30/2024 0834   CALCIUM  9.2 09/10/2024 1155   GFRNONAA >60 07/29/2024 1039   Lab Results  Component Value Date   HGBA1C 5.7 (H) 09/10/2024   HGBA1C 5.8 (H) 10/06/2023   Lab Results  Component Value Date   INSULIN  6.6 09/10/2024   Lab Results  Component Value Date   TSH 2.250 09/10/2024   CBC    Component Value Date/Time   WBC 8.0 07/29/2024 1039   RBC 4.25 07/29/2024 1039   HGB 13.3 07/29/2024 1039   HCT 39.4 07/29/2024 1039   PLT 219  07/29/2024 1039   MCV 92.7 07/29/2024 1039   MCH 31.3 07/29/2024 1039   MCHC 33.8 07/29/2024 1039   RDW 13.9 07/29/2024 1039   Iron Studies No results found for: IRON, TIBC, FERRITIN, IRONPCTSAT Lipid Panel     Component Value Date/Time   CHOL 217 (H) 09/10/2024 1155   TRIG 94 09/10/2024 1155   HDL 72 09/10/2024 1155   CHOLHDL 3.0 09/10/2024 1155   CHOLHDL 2.6 01/30/2024 0834   LDLCALC 129 (H) 09/10/2024 1155   LDLCALC 109 (H) 01/30/2024 0834   Hepatic Function Panel     Component Value Date/Time   PROT 6.8 09/10/2024 1155   ALBUMIN 4.2 09/10/2024 1155   AST 20 09/10/2024 1155   ALT 15 09/10/2024 1155   ALKPHOS 73 09/10/2024 1155   BILITOT 0.3 09/10/2024 1155      Component Value Date/Time   TSH 2.250 09/10/2024 1155   Nutritional Lab Results  Component Value Date   VD25OH 71.3 09/10/2024     ASSESSMENT AND PLAN  Class 1 obesity with serious comorbidity and body mass index (BMI) of 33.0 to 33.9 in adult, unspecified obesity type  TREATMENT PLAN FOR OBESITY:  Recommended Dietary Goals  Cheryl Shepherd is currently in the action stage of change. As such, her goal is to continue weight management plan. She has agreed to the Category 1 Plan.  Behavioral Intervention  We discussed the following Behavioral Modification Strategies today: increasing lean protein intake to established goals, increasing water intake , work on meal planning and preparation, better snacking choices, continue to work on maintaining a reduced calorie state, getting the recommended amount of protein, incorporating whole foods, making healthy choices, staying well hydrated and practicing mindfulness when eating., and increase protein intake, fibrous foods (25 grams per day for women, 30 grams for men) and water to improve satiety and decrease hunger signals. .Discussed different option she cause use when she has sweets cravings such as [place]asso bars, apple with peanut butter, sugar free jello or  pudding.  Celebration Eating Strategies She plans to maintain her weight in December - She wants to have small indulgences, but she is not going to waste her calories on things that are not wonderful. - Outside of social situations she will continue to follow a structured eating plan but enjoy herself with portion control for holiday events, parties and get-togethers - She does recognize that this strategy is to help her maintain her weight, not lose weight and in January she will return to a structured plan for weight loss   Recommended Physical Activity Goals  Cheryl Shepherd has been advised to work up to 150 minutes of moderate intensity aerobic activity a week and strengthening exercises 2-3 times per week for cardiovascular health, weight  loss maintenance and preservation of muscle mass.   She has agreed to Continue current level of physical activity , Increase physical activity in their day and reduce sedentary time (increase NEAT)., and Combine aerobic and strengthening exercises for efficiency and improved cardiometabolic health.   Pharmacotherapy We discussed various medication options to help Cheryl Shepherd with her weight loss efforts and we both agreed to continue with nutrition and behavior modification.  ASSOCIATED CONDITIONS ADDRESSED TODAY  Action/Plan  Primary hypertension Continue hydrochlorothiazide  25 mg every day Continue Category 1 meal plan  and DASH diet Monitor BP and if consistently >140/90 notify PCP If develops headaches, chest pain, shortness of breath or dizziness go to ER Continue to follow regularly with PCP  Obstructive sleep apnea syndrome       Use CPAP 100% of the time       Decreasing body weight by 10-15% can improve AHI   Pure hypercholesterolemia Focus on implementing category 1 meal plan, limit saturated fats and simple carbs Follow regularly with PCP Focus on getting 150 minutes a week of moderate to high intensity exercise   Vitamin D  deficiency Low  vitamin D  levels can be associated with adiposity and may result in leptin resistance and weight gain. Also associated with fatigue.  Currently on vitamin D  supplementation without any adverse effects such as nausea, vomiting or muscle weakness.    Osteoarthritis of multiple sites       Continue to follow with rheumatology at [facility]       Plaquenil  200 mg 2 tabs daily and gabapentin  300 mg 2 tabs at bedtime     Return in about 5 weeks (around 12/12/2024) for due to holidays.. She was informed of the importance of frequent follow up visits to maximize her success with intensive lifestyle modifications for her multiple health conditions.   ATTESTASTION STATEMENTS:  Reviewed by clinician on day of visit: allergies, medications, problem list, medical history, surgical history, family history, social history, and previous encounter notes.   I personally spent a total of 36 minutes in the care of the patient today including preparing to see the patient, getting/reviewing separately obtained history, performing a medically appropriate exam/evaluation, counseling and educating, and documenting clinical information in the EHR.   Cheryl Shepherd ANP-C

## 2024-11-17 ENCOUNTER — Other Ambulatory Visit: Payer: Self-pay

## 2024-11-17 MED FILL — Gabapentin Cap 300 MG: ORAL | 90 days supply | Qty: 180 | Fill #1 | Status: AC

## 2024-11-17 MED FILL — Hydrochlorothiazide Tab 25 MG: ORAL | 90 days supply | Qty: 90 | Fill #1 | Status: AC

## 2024-11-22 DIAGNOSIS — M9903 Segmental and somatic dysfunction of lumbar region: Secondary | ICD-10-CM | POA: Diagnosis not present

## 2024-11-22 DIAGNOSIS — M50323 Other cervical disc degeneration at C6-C7 level: Secondary | ICD-10-CM | POA: Diagnosis not present

## 2024-11-22 DIAGNOSIS — M5451 Vertebrogenic low back pain: Secondary | ICD-10-CM | POA: Diagnosis not present

## 2024-11-22 DIAGNOSIS — M7918 Myalgia, other site: Secondary | ICD-10-CM | POA: Diagnosis not present

## 2024-11-22 DIAGNOSIS — M9904 Segmental and somatic dysfunction of sacral region: Secondary | ICD-10-CM | POA: Diagnosis not present

## 2024-11-22 DIAGNOSIS — M5136 Other intervertebral disc degeneration, lumbar region with discogenic back pain only: Secondary | ICD-10-CM | POA: Diagnosis not present

## 2024-11-22 DIAGNOSIS — M9901 Segmental and somatic dysfunction of cervical region: Secondary | ICD-10-CM | POA: Diagnosis not present

## 2024-11-22 DIAGNOSIS — M9905 Segmental and somatic dysfunction of pelvic region: Secondary | ICD-10-CM | POA: Diagnosis not present

## 2024-11-22 DIAGNOSIS — M542 Cervicalgia: Secondary | ICD-10-CM | POA: Diagnosis not present

## 2024-11-22 DIAGNOSIS — M50322 Other cervical disc degeneration at C5-C6 level: Secondary | ICD-10-CM | POA: Diagnosis not present

## 2024-11-24 ENCOUNTER — Other Ambulatory Visit: Payer: Self-pay

## 2024-12-10 ENCOUNTER — Ambulatory Visit (INDEPENDENT_AMBULATORY_CARE_PROVIDER_SITE_OTHER): Admitting: Nurse Practitioner

## 2024-12-10 ENCOUNTER — Encounter (INDEPENDENT_AMBULATORY_CARE_PROVIDER_SITE_OTHER): Payer: Self-pay | Admitting: Nurse Practitioner

## 2024-12-10 VITALS — BP 127/73 | HR 73 | Temp 98.1°F | Ht 63.0 in | Wt 188.0 lb

## 2024-12-10 DIAGNOSIS — E7800 Pure hypercholesterolemia, unspecified: Secondary | ICD-10-CM | POA: Diagnosis not present

## 2024-12-10 DIAGNOSIS — E559 Vitamin D deficiency, unspecified: Secondary | ICD-10-CM

## 2024-12-10 DIAGNOSIS — I10 Essential (primary) hypertension: Secondary | ICD-10-CM | POA: Diagnosis not present

## 2024-12-10 DIAGNOSIS — E66811 Obesity, class 1: Secondary | ICD-10-CM

## 2024-12-10 DIAGNOSIS — Z6833 Body mass index (BMI) 33.0-33.9, adult: Secondary | ICD-10-CM

## 2024-12-10 NOTE — Progress Notes (Signed)
 " Office: [phone]  /  Fax: [phone]  WEIGHT SUMMARY AND BIOMETRICS  Weight Lost Since Last Visit: 0  Weight Gained Since Last Visit: 1 lb   Vitals Temp: 98.1 F (36.7 C) BP: 127/73 Pulse Rate: 73 SpO2: 97 %   Anthropometric Measurements Height: 5' 3 (1.6 m) Weight: 188 lb (85.3 kg) BMI (Calculated): 33.31 Weight at Last Visit: 187 lb Weight Lost Since Last Visit: 0 Weight Gained Since Last Visit: 1 lb Starting Weight: 194 lb Total Weight Loss (lbs): 6 lb (2.722 kg) Peak Weight: 220 lb   Body Composition  Body Fat %: 42.5 % Fat Mass (lbs): 80 lbs Muscle Mass (lbs): 103 lbs Total Body Water (lbs): 78.8 lbs Visceral Fat Rating : 12   Other Clinical Data Fasting: No Labs: No Today's Visit #: 4 Starting Date: 09/10/24    Total Weight Loss: 6 pounds( gained 1 pound this visit) Bio Impedance Data reviewed with patient: Muscle is up 1.8 pounds, adipose is down 1.2 pounds  HPI  Chief Complaint: OBESITY  Cheryl Shepherd is here to discuss her progress with her obesity treatment plan. She is on the practicing portion control and making smarter food choices, such as increasing vegetables and decreasing simple carbohydrates and states she is following her eating plan approximately 0 % of the time. She states she has not been exercising but started back to the [facility] this AM   Interval History:  Since last office visit she celebrated Christmas- had more things off plan and did not get enough protein. She was also at the beach for a week- more eating out. She went to the [facility] this morning but a lot of the machines were full, then did treadmill/bike and arm exercises. She plans to go 3-4 days/week to the [facility].  She does plan to increase water to 80 ounces a day.   Cheryl Shepherd does have hypertension and her BP's are currently well controlled on hydrochlorothiazide  25 mg every day. Denies headaches, chest pain shortness of breath at rest and dizziness  BP Readings from Last 3  Encounters:  12/10/24 127/73  11/07/24 126/73  10/08/24 122/82   She continues to work on nutrition and weight loss for hyperlipidemia- no current medication Lab Results  Component Value Date   CHOL 217 (H) 09/10/2024   HDL 72 09/10/2024   LDLCALC 129 (H) 09/10/2024   TRIG 94 09/10/2024   CHOLHDL 3.0 09/10/2024    She does have Vit D deficiency and is currently in therapeutic range on dose of cholecalciferol 1000 mcg BID Last vitamin D  Lab Results  Component Value Date   VD25OH 71.3 09/10/2024    PHYSICAL EXAM:  Blood pressure 127/73, pulse 73, temperature 98.1 F (36.7 C), height 5' 3 (1.6 m), weight 188 lb (85.3 kg), SpO2 97%. Body mass index is 33.3 kg/m.  General: Well Developed, well nourished, and in no acute distress.  HEENT: Normocephalic, atraumatic; EOMI, sclerae are anicteric. Skin: Warm and dry, good turgor Chest:  Normal excursion, shape, no gross ABN Respiratory: No conversational dyspnea; speaking in full sentences NeuroM-Sk:  Normal gross ROM * 4 extremities  Psych: A and O X 3, insight adequate, mood- full    DIAGNOSTIC DATA REVIEWED:  Last metabolic panel Lab Results  Component Value Date   GLUCOSE 74 09/10/2024   NA 140 09/10/2024   K 4.0 09/10/2024   CL 99 09/10/2024   CO2 25 09/10/2024   BUN 16 09/10/2024   CREATININE 0.75 09/10/2024   EGFR 89 09/10/2024  CALCIUM  9.2 09/10/2024   PROT 6.8 09/10/2024   ALBUMIN 4.2 09/10/2024   LABGLOB 2.6 09/10/2024   BILITOT 0.3 09/10/2024   ALKPHOS 73 09/10/2024   AST 20 09/10/2024   ALT 15 09/10/2024   ANIONGAP 11 07/29/2024     Lab Results  Component Value Date   HGBA1C 5.7 (H) 09/10/2024   HGBA1C 5.8 (H) 10/06/2023   Lab Results  Component Value Date   INSULIN  6.6 09/10/2024   Lab Results  Component Value Date   TSH 2.250 09/10/2024   CBC    Component Value Date/Time   WBC 8.0 07/29/2024 1039   RBC 4.25 07/29/2024 1039   HGB 13.3 07/29/2024 1039   HCT 39.4 07/29/2024 1039   PLT  219 07/29/2024 1039   MCV 92.7 07/29/2024 1039   MCH 31.3 07/29/2024 1039   MCHC 33.8 07/29/2024 1039   RDW 13.9 07/29/2024 1039   Lipid Panel     Component Value Date/Time   CHOL 217 (H) 09/10/2024 1155   TRIG 94 09/10/2024 1155   HDL 72 09/10/2024 1155   CHOLHDL 3.0 09/10/2024 1155   CHOLHDL 2.6 01/30/2024 0834   LDLCALC 129 (H) 09/10/2024 1155   LDLCALC 109 (H) 01/30/2024 0834    Nutritional Lab Results  Component Value Date   VD25OH 71.3 09/10/2024    Lab Results  Component Value Date   VITAMINB12 984 09/10/2024     ASSESSMENT AND PLAN Class 1 obesity with serious comorbidity and body mass index (BMI) of 33.0 to 33.9 in adult, unspecified obesity type TREATMENT PLAN FOR OBESITY:  Recommended Dietary Goals  Cheryl Shepherd is currently in the action stage of change. As such, her goal is to continue weight management plan. She has agreed to practicing portion control and making smarter food choices, such as increasing vegetables and decreasing simple carbohydrates.  Behavioral Intervention  We discussed the following Behavioral Modification Strategies today: increasing lean protein intake to established goals, increasing fiber rich foods, avoiding skipping meals, increasing water intake , continue to work on maintaining a reduced calorie state, getting the recommended amount of protein, incorporating whole foods, making healthy choices, staying well hydrated and practicing mindfulness when eating., and increase protein intake, fibrous foods (25 grams per day for women, 30 grams for men) and water to improve satiety and decrease hunger signals. .   Recommended Physical Activity Goals  Cheryl Shepherd has been advised to work up to 150 minutes of moderate intensity aerobic activity a week and strengthening exercises 2-3 times per week for cardiovascular health, weight loss maintenance and preservation of muscle mass.   She has agreed to [employer] strengthening exercises with a goal of 2-3  sessions a week  and Start aerobic activity with a goal of 150 minutes a week at moderate intensity.    Pharmacotherapy We discussed various medication options to help Cheryl Shepherd with her weight loss efforts and we both agreed to continue nutrition and behavior modification.  ASSOCIATED CONDITIONS ADDRESSED TODAY  Action/Plan  Primary hypertension Continue hydrochlorothiazide  25 mg every day Continue practicing portion control and making smarter food choices, such as increasing vegetables and decreasing simple carbohydrates  and DASH diet Monitor BP and if consistently >140/90 notify PCP If develops headaches, chest pain, shortness of breath or dizziness go to ER Continue to follow regularly with PCP  Pure hypercholesterolemia Focus on implementing practicing portion control and making smarter food choices, such as increasing vegetables and decreasing simple carbohydrates , limit saturated fats Continue to follow regularly with PCP Focus  on getting 150 minutes a week of moderate to high intensity exercise with strength training  Vitamin D  deficiency       Low vitamin D  levels can be associated with adiposity and may result in leptin resistance and weight gain. Also associated with fatigue.        Currently on vitamin D  supplementation without any adverse effects such as nausea, vomiting or muscle weakness.              Return in about 4 weeks (around 01/07/2025).Cheryl Shepherd She was informed of the importance of frequent follow up visits to maximize her success with intensive lifestyle modifications for her multiple health conditions.   ATTESTASTION STATEMENTS:  Reviewed by clinician on day of visit: allergies, medications, problem list, medical history, surgical history, family history, social history, and previous encounter notes.   I personally spent a total of 30 minutes in the care of the patient today including preparing to see the patient, getting/reviewing separately obtained history,  performing a medically appropriate exam/evaluation, counseling and educating, and documenting clinical information in the EHR.   Lonell Liverpool ANP-C "

## 2024-12-13 ENCOUNTER — Other Ambulatory Visit: Payer: Self-pay | Admitting: Family Medicine

## 2024-12-13 DIAGNOSIS — Z1231 Encounter for screening mammogram for malignant neoplasm of breast: Secondary | ICD-10-CM

## 2025-01-03 ENCOUNTER — Other Ambulatory Visit

## 2025-01-03 DIAGNOSIS — Z6833 Body mass index (BMI) 33.0-33.9, adult: Secondary | ICD-10-CM

## 2025-01-03 DIAGNOSIS — G4733 Obstructive sleep apnea (adult) (pediatric): Secondary | ICD-10-CM

## 2025-01-03 DIAGNOSIS — I10 Essential (primary) hypertension: Secondary | ICD-10-CM

## 2025-01-03 DIAGNOSIS — E7800 Pure hypercholesterolemia, unspecified: Secondary | ICD-10-CM

## 2025-01-03 DIAGNOSIS — E782 Mixed hyperlipidemia: Secondary | ICD-10-CM

## 2025-01-07 ENCOUNTER — Ambulatory Visit (INDEPENDENT_AMBULATORY_CARE_PROVIDER_SITE_OTHER): Admitting: Nurse Practitioner

## 2025-01-07 ENCOUNTER — Ambulatory Visit: Payer: Self-pay | Admitting: Family Medicine

## 2025-01-08 ENCOUNTER — Ambulatory Visit: Admitting: Family Medicine

## 2025-01-08 ENCOUNTER — Encounter: Payer: Self-pay | Admitting: Family Medicine

## 2025-01-08 VITALS — BP 122/70 | HR 71 | Temp 98.1°F | Ht 63.0 in | Wt 192.0 lb

## 2025-01-08 DIAGNOSIS — R7303 Prediabetes: Secondary | ICD-10-CM | POA: Diagnosis not present

## 2025-01-08 DIAGNOSIS — E7800 Pure hypercholesterolemia, unspecified: Secondary | ICD-10-CM

## 2025-01-08 DIAGNOSIS — I10 Essential (primary) hypertension: Secondary | ICD-10-CM

## 2025-01-08 DIAGNOSIS — F411 Generalized anxiety disorder: Secondary | ICD-10-CM | POA: Diagnosis not present

## 2025-01-08 NOTE — Progress Notes (Signed)
 "  Acute Office Visit  Patient ID: APPLE DEARMAS, female    DOB: 03-Jan-1961, 64 y.o.   MRN: [id number]  PCP: Kayla Jeoffrey RAMAN, FNP  Chief Complaint  Patient presents with   Medical Management of Chronic Issues    3 month f/u     Subjective:     HPI  [name] is here today for follow-up hyperlipidemia. Lipid Panel     Component Value Date/Time   CHOL 186 01/03/2025 0823   CHOL 217 (H) 09/10/2024 1155   TRIG 80 01/03/2025 0823   HDL 62 01/03/2025 0823   HDL 72 09/10/2024 1155   CHOLHDL 3.0 01/03/2025 0823   LDLCALC 107 (H) 01/03/2025 0823   LABVLDL 16 09/10/2024 1155   The 10-year ASCVD risk score (Arnett DK, et al., 2019) is: 10.3%   Values used to calculate the score:     Age: 28 years     Clinically relevant sex: Female     Is Non-Hispanic African American: No     Diabetic: No     Tobacco smoker: Yes     Systolic Blood Pressure: 122 mmHg     Is BP treated: Yes     HDL Cholesterol: 62 mg/dL     Total Cholesterol: 186 mg/dL   Discussed the use of AI scribe software for clinical note transcription with the patient, who gave verbal consent to proceed.  History of Present Illness Cheryl Shepherd is a 64 year old female who presents for follow-up on cholesterol management.  Cholesterol levels have improved since the last visit, with a decrease in LDL cholesterol. She has been taking a supplement containing omega-3 fatty acids, flax seed oil, and plant sterols.  She faces challenges with diet and exercise due to poor weather and difficulty coordinating with her exercise partner. Physical activity has been sporadic. She is involved in medical weight management but feels discouraged by recent lack of progress.  There is a family history of cardiac issues, with a brother who had a cardiac event in his mid-fifties and a father who underwent open heart surgery at 40. She has not experienced any cardiac events herself.  She has been taking Lexapro  but stopped abruptly due  to feeling emotionally numb. She feels fine without it, although she experienced withdrawal symptoms such as 'rocks rolling around in my head'.  Her glucose was slightly elevated at 101, which she attributes to recent dietary choices. She has a history of prediabetes. No diabetes, no recent cardiac events.   Review of Systems  All other systems reviewed and are negative.   Past Medical History:  Diagnosis Date   Anxiety    Cancer (HCC)    skin   Constipation    Eczema    Gallbladder attack    History of swelling of feet    Hyperlipidemia    Hypertension    Leg swelling    Osteoarthritis    Restless leg syndrome    Rheumatoid arteritis (HCC)    Skin cancer    Sleep apnea    Vitamin D  deficiency     Past Surgical History:  Procedure Laterality Date   ABLATION     CARPECTOMY     right wrist   CHOLECYSTECTOMY     COLONOSCOPY WITH PROPOFOL  N/A 02/22/2020   Procedure: COLONOSCOPY WITH PROPOFOL ;  Surgeon: Unk Corinn Skiff, MD;  Location: [facility] ENDOSCOPY;  Service: Gastroenterology;  Laterality: N/A;   EYE SURGERY  2023   eyelid reduction   JOINT  REPLACEMENT     KNEE SURGERY  06/2021   total replacement on L-knee   REPLACEMENT TOTAL KNEE Left     Outpatient Medications Prior to Visit  Medication Sig Dispense Refill   albuterol  (VENTOLIN  HFA) 108 (90 Base) MCG/ACT inhaler Inhale 2 puffs into the lungs every 6 (six) hours as needed for wheezing or shortness of breath. 6.7 g 2   Ascorbic Acid (VITAMIN C) 1000 MG tablet Take 1,000 mg by mouth daily.     Cholecalciferol (VITAMIN D3) 125 MCG (5000 UT) CAPS Take 1 capsule by mouth daily.     clindamycin  (CLEOCIN ) 300 MG capsule Take 2 capsules, one hour before dental appt (Patient taking differently: Take 600 mg by mouth as needed (when going for dental treatmet).) 4 capsule 1   cyanocobalamin  (VITAMIN B12) 1000 MCG tablet Take 1,000 mcg by mouth in the morning and at bedtime.     gabapentin  (NEURONTIN ) 300 MG capsule Take 2  capsules (600 mg total) by mouth at bedtime. 180 capsule 1   hydrochlorothiazide  (HYDRODIURIL ) 25 MG tablet Take 1 tablet (25 mg total) by mouth daily. 90 tablet 1   hydroxychloroquine  (PLAQUENIL ) 200 MG tablet Take 2 tablets (400 mg total) by mouth daily. 180 tablet 1   neomycin -polymyxin-dexamethasone  (MAXITROL ) 0.1 % ophthalmic suspension Place 1 drop into affected eye 3 (three) times daily. (Patient taking differently: Place 1 drop into both eyes 3 (three) times daily as needed.) 5 mL 0   zinc gluconate 50 MG tablet Take 50 mg by mouth daily. (Patient not taking: Reported on 01/08/2025)     escitalopram  (LEXAPRO ) 10 MG tablet Take 1 tablet (10 mg total) by mouth daily. (Patient not taking: Reported on 01/08/2025) 90 tablet 1   No facility-administered medications prior to visit.    Allergies[1]     Objective:    BP 122/70   Pulse 71   Temp 98.1 F (36.7 C)   Ht 5' 3 (1.6 m)   Wt 192 lb (87.1 kg)   LMP  (Exact Date)   SpO2 97%   BMI 34.01 kg/m    Physical Exam Vitals and nursing note reviewed.  Constitutional:      Appearance: Normal appearance. She is obese.  HENT:     Head: Normocephalic and atraumatic.  Cardiovascular:     Rate and Rhythm: Normal rate and regular rhythm.     Pulses: Normal pulses.     Heart sounds: Normal heart sounds.  Pulmonary:     Effort: Pulmonary effort is normal.     Breath sounds: Normal breath sounds.  Skin:    General: Skin is warm and dry.  Neurological:     General: No focal deficit present.     Mental Status: She is alert and oriented to person, place, and time. Mental status is at baseline.  Psychiatric:        Mood and Affect: Mood normal.        Behavior: Behavior normal.        Thought Content: Thought content normal.        Judgment: Judgment normal.       No results found for any visits on 01/08/25.     Assessment & Plan:   Problem List Items Addressed This Visit       Cardiovascular and Mediastinum   Hypertension  - Primary     Other   Generalized anxiety disorder   Morbid obesity (HCC)   Pure hypercholesterolemia   Prediabetes    Assessment  and Plan Assessment & Plan Pure hypercholesterolemia Cholesterol levels improved, LDL at 107 mg/dL. Cardiac risk 10.3%, slightly above statin threshold. Family history increases risk. - Continue lifestyle modifications including diet and exercise. - Monitor cholesterol levels and cardiac risk. - Consider statin therapy if cardiac risk exceeds 10%. - Prefers lifestyle modifications. Encouraged to continue working with [facility].  Morbid obesity Weight management challenging. Considering discontinuing medical weight management due to perceived lack of progress and cost. Emphasized long-term goals and self-accountability. - Discuss with weight management provider about long-term goals and potential adjustments. - Encouraged continuation of lifestyle modifications independently if discontinuing medical weight management.  Generalized anxiety disorder Previously on escitalopram , discontinued due to emotional blunting. Currently managing without medication and reports doing well. - Continue to monitor anxiety symptoms and consider alternative treatments if needed.  Hypertension Blood pressure well-controlled with current management. - Continue current antihypertensive regimen.  Prediabetes Glucose levels slightly elevated, within prediabetes range. - Continue monitoring glucose levels. - Added A1c test to upcoming labs.  General health maintenance Discussed importance of regular physical activity and dietary modifications to reduce cardiovascular risk. - Encouraged 30 minutes of physical activity, five days a week. - Continue dietary modifications to support cardiovascular health.    No orders of the defined types were placed in this encounter.   Return in about 6 months (around 07/08/2025) for chronic follow-up with labs 1 week prior.  Jeoffrey GORMAN Barrio,  FNP [facility]      [1]  Allergies Allergen Reactions   Cortisone Swelling, Hives and Other (See Comments)    Flushing   Diclofenac  Other (See Comments)   Leflunomide Nausea Only   Methotrexate Diarrhea   Penicillin G Other (See Comments)   Sulfa Antibiotics Nausea Only and Other (See Comments)   Sulfacetamide     Other reaction(s): NAUSEA   Penicillins Rash and Other (See Comments)   "

## 2025-01-09 LAB — COMPLETE METABOLIC PANEL WITHOUT GFR
AG Ratio: 1.5 (calc) (ref 1.0–2.5)
ALT: 13 U/L (ref 6–29)
AST: 15 U/L (ref 10–35)
Albumin: 4.1 g/dL (ref 3.6–5.1)
Alkaline phosphatase (APISO): 75 U/L (ref 37–153)
BUN: 16 mg/dL (ref 7–25)
CO2: 34 mmol/L — ABNORMAL HIGH (ref 20–32)
Calcium: 9.5 mg/dL (ref 8.6–10.4)
Chloride: 99 mmol/L (ref 98–110)
Creat: 0.83 mg/dL (ref 0.50–1.05)
Globulin: 2.8 g/dL (ref 1.9–3.7)
Glucose, Bld: 101 mg/dL — ABNORMAL HIGH (ref 65–99)
Potassium: 3.7 mmol/L (ref 3.5–5.3)
Sodium: 138 mmol/L (ref 135–146)
Total Bilirubin: 0.4 mg/dL (ref 0.2–1.2)
Total Protein: 6.9 g/dL (ref 6.1–8.1)

## 2025-01-09 LAB — CBC WITH DIFFERENTIAL/PLATELET
Absolute Lymphocytes: 1365 {cells}/uL (ref 850–3900)
Absolute Monocytes: 496 {cells}/uL (ref 200–950)
Basophils Absolute: 38 {cells}/uL (ref 0–200)
Basophils Relative: 0.9 %
Eosinophils Absolute: 168 {cells}/uL (ref 15–500)
Eosinophils Relative: 4 %
HCT: 42.9 % (ref 35.9–46.0)
Hemoglobin: 13.9 g/dL (ref 11.7–15.5)
MCH: 30.4 pg (ref 27.0–33.0)
MCHC: 32.4 g/dL (ref 31.6–35.4)
MCV: 93.9 fL (ref 81.4–101.7)
MPV: 10 fL (ref 7.5–12.5)
Monocytes Relative: 11.8 %
Neutro Abs: 2134 {cells}/uL (ref 1500–7800)
Neutrophils Relative %: 50.8 %
Platelets: 268 10*3/uL (ref 140–400)
RBC: 4.57 Million/uL (ref 3.80–5.10)
RDW: 12.8 % (ref 11.0–15.0)
Total Lymphocyte: 32.5 %
WBC: 4.2 10*3/uL (ref 3.8–10.8)

## 2025-01-09 LAB — LIPID PANEL
Cholesterol: 186 mg/dL
HDL: 62 mg/dL
LDL Cholesterol (Calc): 107 mg/dL — ABNORMAL HIGH
Non-HDL Cholesterol (Calc): 124 mg/dL
Total CHOL/HDL Ratio: 3 (calc)
Triglycerides: 80 mg/dL

## 2025-01-09 LAB — HEMOGLOBIN A1C
Hgb A1c MFr Bld: 5.7 % — ABNORMAL HIGH
Mean Plasma Glucose: 117 mg/dL
eAG (mmol/L): 6.5 mmol/L

## 2025-01-09 LAB — TEST AUTHORIZATION

## 2025-01-15 ENCOUNTER — Ambulatory Visit (INDEPENDENT_AMBULATORY_CARE_PROVIDER_SITE_OTHER): Admitting: Nurse Practitioner

## 2025-01-31 ENCOUNTER — Encounter

## 2025-02-01 ENCOUNTER — Encounter

## 2025-07-11 ENCOUNTER — Other Ambulatory Visit

## 2025-07-15 ENCOUNTER — Ambulatory Visit: Admitting: Family Medicine

## 2025-10-09 ENCOUNTER — Encounter: Admitting: Family Medicine
